# Patient Record
Sex: Male | Born: 1960
Health system: Southern US, Community
[De-identification: ages and names within clinical notes are randomized; demographics above are authoritative.]

## PROBLEM LIST (undated history)

## (undated) DIAGNOSIS — Z86718 Personal history of other venous thrombosis and embolism: Secondary | ICD-10-CM

## (undated) DIAGNOSIS — D6862 Lupus anticoagulant syndrome: Principal | ICD-10-CM

## (undated) DIAGNOSIS — I890 Lymphedema, not elsewhere classified: Secondary | ICD-10-CM

## (undated) DIAGNOSIS — Z87442 Personal history of urinary calculi: Secondary | ICD-10-CM

## (undated) DIAGNOSIS — I10 Essential (primary) hypertension: Secondary | ICD-10-CM

## (undated) DIAGNOSIS — I4891 Unspecified atrial fibrillation: Secondary | ICD-10-CM

## (undated) HISTORY — DX: Lupus anticoagulant syndrome: D68.62

## (undated) HISTORY — PX: NASAL SEPTUM SURGERY: SHX37

## (undated) HISTORY — DX: Personal history of urinary calculi: Z87.442

---

## 1998-05-15 ENCOUNTER — Inpatient Hospital Stay (HOSPITAL_COMMUNITY): Admission: RE | Admit: 1998-05-15 | Discharge: 1998-05-18 | Payer: Self-pay | Admitting: Vascular Surgery

## 1999-08-06 ENCOUNTER — Emergency Department (HOSPITAL_COMMUNITY): Admission: EM | Admit: 1999-08-06 | Discharge: 1999-08-06 | Payer: Self-pay | Admitting: Internal Medicine

## 2001-01-15 ENCOUNTER — Encounter: Payer: Self-pay | Admitting: Family Medicine

## 2001-01-15 ENCOUNTER — Ambulatory Visit (HOSPITAL_COMMUNITY): Admission: RE | Admit: 2001-01-15 | Discharge: 2001-01-15 | Payer: Self-pay | Admitting: Family Medicine

## 2001-02-01 ENCOUNTER — Emergency Department (HOSPITAL_COMMUNITY): Admission: EM | Admit: 2001-02-01 | Discharge: 2001-02-01 | Payer: Self-pay

## 2001-12-22 ENCOUNTER — Ambulatory Visit (HOSPITAL_COMMUNITY): Admission: RE | Admit: 2001-12-22 | Discharge: 2001-12-22 | Payer: Self-pay | Admitting: Family Medicine

## 2002-09-14 ENCOUNTER — Ambulatory Visit (HOSPITAL_COMMUNITY): Admission: RE | Admit: 2002-09-14 | Discharge: 2002-09-14 | Payer: Self-pay | Admitting: *Deleted

## 2002-11-29 ENCOUNTER — Encounter: Payer: Self-pay | Admitting: *Deleted

## 2002-11-29 ENCOUNTER — Ambulatory Visit (HOSPITAL_COMMUNITY): Admission: RE | Admit: 2002-11-29 | Discharge: 2002-11-29 | Payer: Self-pay | Admitting: *Deleted

## 2003-02-26 ENCOUNTER — Ambulatory Visit: Admission: RE | Admit: 2003-02-26 | Discharge: 2003-02-26 | Payer: Self-pay | Admitting: Family Medicine

## 2003-06-13 ENCOUNTER — Encounter: Admission: RE | Admit: 2003-06-13 | Discharge: 2003-06-13 | Payer: Self-pay | Admitting: Family Medicine

## 2003-09-30 ENCOUNTER — Ambulatory Visit (HOSPITAL_COMMUNITY): Admission: RE | Admit: 2003-09-30 | Discharge: 2003-09-30 | Payer: Self-pay | Admitting: *Deleted

## 2004-05-04 ENCOUNTER — Ambulatory Visit: Admission: RE | Admit: 2004-05-04 | Discharge: 2004-05-04 | Payer: Self-pay | Admitting: Family Medicine

## 2004-06-09 ENCOUNTER — Ambulatory Visit: Payer: Self-pay | Admitting: Hematology & Oncology

## 2004-09-14 ENCOUNTER — Ambulatory Visit: Payer: Self-pay | Admitting: Physical Medicine and Rehabilitation

## 2004-09-14 ENCOUNTER — Encounter
Admission: RE | Admit: 2004-09-14 | Discharge: 2004-12-13 | Payer: Self-pay | Admitting: Physical Medicine and Rehabilitation

## 2004-09-21 ENCOUNTER — Encounter
Admission: RE | Admit: 2004-09-21 | Discharge: 2004-10-22 | Payer: Self-pay | Admitting: Physical Medicine and Rehabilitation

## 2004-10-19 ENCOUNTER — Ambulatory Visit: Payer: Self-pay | Admitting: Physical Medicine and Rehabilitation

## 2004-10-26 ENCOUNTER — Encounter
Admission: RE | Admit: 2004-10-26 | Discharge: 2004-11-11 | Payer: Self-pay | Admitting: Physical Medicine and Rehabilitation

## 2004-12-15 ENCOUNTER — Encounter
Admission: RE | Admit: 2004-12-15 | Discharge: 2005-03-15 | Payer: Self-pay | Admitting: Physical Medicine and Rehabilitation

## 2004-12-23 ENCOUNTER — Ambulatory Visit: Payer: Self-pay | Admitting: Physical Medicine and Rehabilitation

## 2005-01-11 ENCOUNTER — Ambulatory Visit: Payer: Self-pay | Admitting: Oncology

## 2005-01-11 ENCOUNTER — Observation Stay (HOSPITAL_COMMUNITY): Admission: EM | Admit: 2005-01-11 | Discharge: 2005-01-12 | Payer: Self-pay | Admitting: *Deleted

## 2005-01-12 ENCOUNTER — Ambulatory Visit: Payer: Self-pay | Admitting: Hematology & Oncology

## 2005-02-02 ENCOUNTER — Ambulatory Visit: Admission: RE | Admit: 2005-02-02 | Discharge: 2005-02-02 | Payer: Self-pay | Admitting: Hematology & Oncology

## 2005-02-17 ENCOUNTER — Ambulatory Visit: Payer: Self-pay | Admitting: Physical Medicine and Rehabilitation

## 2005-03-03 ENCOUNTER — Ambulatory Visit: Payer: Self-pay | Admitting: Hematology & Oncology

## 2005-03-17 ENCOUNTER — Encounter
Admission: RE | Admit: 2005-03-17 | Discharge: 2005-06-15 | Payer: Self-pay | Admitting: Physical Medicine and Rehabilitation

## 2005-04-21 ENCOUNTER — Ambulatory Visit: Payer: Self-pay | Admitting: Physical Medicine and Rehabilitation

## 2005-04-28 ENCOUNTER — Ambulatory Visit: Payer: Self-pay | Admitting: Hematology & Oncology

## 2005-06-10 ENCOUNTER — Ambulatory Visit: Payer: Self-pay | Admitting: Hematology & Oncology

## 2005-06-15 ENCOUNTER — Encounter
Admission: RE | Admit: 2005-06-15 | Discharge: 2005-09-13 | Payer: Self-pay | Admitting: Physical Medicine and Rehabilitation

## 2005-06-15 ENCOUNTER — Ambulatory Visit: Payer: Self-pay | Admitting: Physical Medicine and Rehabilitation

## 2005-06-17 ENCOUNTER — Encounter (INDEPENDENT_AMBULATORY_CARE_PROVIDER_SITE_OTHER): Payer: Self-pay | Admitting: *Deleted

## 2005-06-17 ENCOUNTER — Ambulatory Visit (HOSPITAL_COMMUNITY): Admission: RE | Admit: 2005-06-17 | Discharge: 2005-06-17 | Payer: Self-pay | Admitting: Oncology

## 2005-07-22 LAB — PROTIME-INR: INR: 2.8 (ref 2.00–3.50)

## 2005-07-28 ENCOUNTER — Ambulatory Visit: Payer: Self-pay | Admitting: Hematology & Oncology

## 2005-07-29 LAB — PROTIME-INR

## 2005-08-05 LAB — PROTIME-INR
INR: 3.1 (ref 2.00–3.50)
Protime: 21.3 Seconds — ABNORMAL HIGH (ref 10.6–13.4)

## 2005-08-12 ENCOUNTER — Ambulatory Visit: Payer: Self-pay | Admitting: Physical Medicine and Rehabilitation

## 2005-08-19 ENCOUNTER — Ambulatory Visit (HOSPITAL_COMMUNITY)
Admission: RE | Admit: 2005-08-19 | Discharge: 2005-08-19 | Payer: Self-pay | Admitting: Physical Medicine and Rehabilitation

## 2005-09-12 ENCOUNTER — Encounter
Admission: RE | Admit: 2005-09-12 | Discharge: 2005-09-12 | Payer: Self-pay | Admitting: Physical Medicine and Rehabilitation

## 2005-09-12 ENCOUNTER — Ambulatory Visit: Payer: Self-pay | Admitting: Physical Medicine and Rehabilitation

## 2005-10-04 ENCOUNTER — Encounter
Admission: RE | Admit: 2005-10-04 | Discharge: 2006-01-02 | Payer: Self-pay | Admitting: Physical Medicine and Rehabilitation

## 2005-10-04 ENCOUNTER — Ambulatory Visit: Payer: Self-pay | Admitting: Physical Medicine and Rehabilitation

## 2005-11-04 ENCOUNTER — Ambulatory Visit: Payer: Self-pay | Admitting: Physical Medicine and Rehabilitation

## 2005-11-29 ENCOUNTER — Ambulatory Visit: Payer: Self-pay | Admitting: Physical Medicine and Rehabilitation

## 2005-12-22 ENCOUNTER — Ambulatory Visit: Payer: Self-pay | Admitting: Hematology & Oncology

## 2005-12-22 LAB — PROTIME-INR: Protime: 28.8 Seconds — ABNORMAL HIGH (ref 10.6–13.4)

## 2005-12-23 ENCOUNTER — Encounter: Payer: Self-pay | Admitting: Vascular Surgery

## 2005-12-23 ENCOUNTER — Ambulatory Visit: Admission: RE | Admit: 2005-12-23 | Discharge: 2005-12-23 | Payer: Self-pay | Admitting: Hematology & Oncology

## 2005-12-26 ENCOUNTER — Ambulatory Visit (HOSPITAL_COMMUNITY): Admission: RE | Admit: 2005-12-26 | Discharge: 2005-12-26 | Payer: Self-pay | Admitting: Hematology & Oncology

## 2006-01-24 ENCOUNTER — Ambulatory Visit: Payer: Self-pay | Admitting: Physical Medicine and Rehabilitation

## 2006-01-24 ENCOUNTER — Encounter
Admission: RE | Admit: 2006-01-24 | Discharge: 2006-04-24 | Payer: Self-pay | Admitting: Physical Medicine and Rehabilitation

## 2006-02-14 ENCOUNTER — Ambulatory Visit: Payer: Self-pay | Admitting: Hematology & Oncology

## 2006-02-16 LAB — CBC WITH DIFFERENTIAL/PLATELET
BASO%: 1 % (ref 0.0–2.0)
EOS%: 3.2 % (ref 0.0–7.0)
HGB: 14.9 g/dL (ref 13.0–17.1)
MONO#: 0.4 10*3/uL (ref 0.1–0.9)
MONO%: 6.3 % (ref 0.0–13.0)
NEUT#: 2.8 10*3/uL (ref 1.5–6.5)
Platelets: 248 10*3/uL (ref 145–400)
RBC: 5.38 10*6/uL (ref 4.20–5.71)
RDW: 11.9 % (ref 11.2–14.6)
lymph#: 2.4 10*3/uL (ref 0.9–3.3)

## 2006-02-16 LAB — PROTIME-INR

## 2006-03-02 ENCOUNTER — Ambulatory Visit: Payer: Self-pay | Admitting: Physical Medicine and Rehabilitation

## 2006-03-02 LAB — PROTIME-INR: INR: 3.5 (ref 2.00–3.50)

## 2006-03-16 LAB — PROTIME-INR

## 2006-03-27 ENCOUNTER — Ambulatory Visit: Payer: Self-pay | Admitting: Hematology & Oncology

## 2006-03-30 LAB — PROTIME-INR: Protime: 30 Seconds — ABNORMAL HIGH (ref 10.6–13.4)

## 2006-04-13 ENCOUNTER — Ambulatory Visit: Payer: Self-pay | Admitting: Physical Medicine and Rehabilitation

## 2006-04-27 LAB — PROTIME-INR: Protime: 34.8 Seconds — ABNORMAL HIGH (ref 10.6–13.4)

## 2006-05-09 ENCOUNTER — Encounter
Admission: RE | Admit: 2006-05-09 | Discharge: 2006-08-07 | Payer: Self-pay | Admitting: Physical Medicine and Rehabilitation

## 2006-05-09 ENCOUNTER — Ambulatory Visit: Payer: Self-pay | Admitting: Hematology & Oncology

## 2006-05-11 LAB — PROTIME-INR: Protime: 25.2 Seconds — ABNORMAL HIGH (ref 10.6–13.4)

## 2006-06-08 LAB — PROTIME-INR
INR: 2 (ref 2.00–3.50)
Protime: 24 Seconds — ABNORMAL HIGH (ref 10.6–13.4)

## 2006-06-20 ENCOUNTER — Ambulatory Visit: Payer: Self-pay | Admitting: Hematology & Oncology

## 2006-06-22 LAB — PROTIME-INR
INR: 3 (ref 2.00–3.50)
Protime: 36 Seconds — ABNORMAL HIGH (ref 10.6–13.4)

## 2006-07-06 LAB — CBC WITH DIFFERENTIAL/PLATELET
BASO%: 1 % (ref 0.0–2.0)
EOS%: 5 % (ref 0.0–7.0)
HCT: 46.1 % (ref 38.7–49.9)
MCH: 27.7 pg — ABNORMAL LOW (ref 28.0–33.4)
MCHC: 34 g/dL (ref 32.0–35.9)
MONO#: 0.4 10*3/uL (ref 0.1–0.9)
RBC: 5.67 10*6/uL (ref 4.20–5.71)
RDW: 11.4 % (ref 11.2–14.6)
WBC: 6.6 10*3/uL (ref 4.0–10.0)
lymph#: 2.1 10*3/uL (ref 0.9–3.3)

## 2006-07-27 LAB — PROTIME-INR
INR: 1.9 — ABNORMAL LOW (ref 2.00–3.50)
Protime: 22.8 Seconds — ABNORMAL HIGH (ref 10.6–13.4)

## 2006-08-15 ENCOUNTER — Ambulatory Visit: Payer: Self-pay | Admitting: Hematology & Oncology

## 2006-08-17 LAB — PROTIME-INR: Protime: 19.2 Seconds — ABNORMAL HIGH (ref 10.6–13.4)

## 2006-09-07 LAB — PROTIME-INR: Protime: 31.2 Seconds — ABNORMAL HIGH (ref 10.6–13.4)

## 2006-09-26 ENCOUNTER — Ambulatory Visit: Payer: Self-pay | Admitting: Hematology & Oncology

## 2006-09-28 LAB — PROTIME-INR
INR: 3.5 (ref 2.00–3.50)
Protime: 42 Seconds — ABNORMAL HIGH (ref 10.6–13.4)

## 2006-10-19 LAB — PROTIME-INR: INR: 2.3 (ref 2.00–3.50)

## 2006-10-19 LAB — CBC WITH DIFFERENTIAL/PLATELET
BASO%: 0.6 % (ref 0.0–2.0)
EOS%: 1.9 % (ref 0.0–7.0)
MCH: 27 pg — ABNORMAL LOW (ref 28.0–33.4)
MCHC: 33.9 g/dL (ref 32.0–35.9)
MONO%: 8 % (ref 0.0–13.0)
NEUT%: 61.1 % (ref 40.0–75.0)
RDW: 11.1 % — ABNORMAL LOW (ref 11.2–14.6)
lymph#: 1.9 10*3/uL (ref 0.9–3.3)

## 2006-11-06 ENCOUNTER — Ambulatory Visit: Payer: Self-pay | Admitting: Hematology & Oncology

## 2006-11-09 ENCOUNTER — Encounter: Admission: RE | Admit: 2006-11-09 | Discharge: 2007-02-07 | Payer: Self-pay | Admitting: Family Medicine

## 2006-11-09 LAB — PROTIME-INR

## 2006-11-14 ENCOUNTER — Ambulatory Visit: Admission: RE | Admit: 2006-11-14 | Discharge: 2006-11-14 | Payer: Self-pay | Admitting: Hematology & Oncology

## 2006-11-14 ENCOUNTER — Encounter: Payer: Self-pay | Admitting: Hematology & Oncology

## 2006-12-19 ENCOUNTER — Ambulatory Visit: Payer: Self-pay | Admitting: Hematology & Oncology

## 2006-12-21 LAB — PROTIME-INR: Protime: 33.6 Seconds — ABNORMAL HIGH (ref 10.6–13.4)

## 2007-01-11 LAB — CBC WITH DIFFERENTIAL/PLATELET
BASO%: 0.9 % (ref 0.0–2.0)
Eosinophils Absolute: 0.3 10*3/uL (ref 0.0–0.5)
LYMPH%: 31.2 % (ref 14.0–48.0)
MONO#: 0.4 10*3/uL (ref 0.1–0.9)
NEUT#: 3.4 10*3/uL (ref 1.5–6.5)
Platelets: 235 10*3/uL (ref 145–400)
RBC: 5.17 10*6/uL (ref 4.20–5.71)
RDW: 11.1 % — ABNORMAL LOW (ref 11.2–14.6)
WBC: 6.1 10*3/uL (ref 4.0–10.0)
lymph#: 1.9 10*3/uL (ref 0.9–3.3)

## 2007-01-11 LAB — PROTIME-INR: INR: 2.4 (ref 2.00–3.50)

## 2007-01-30 ENCOUNTER — Ambulatory Visit: Payer: Self-pay | Admitting: Hematology & Oncology

## 2007-02-01 LAB — PROTIME-INR: INR: 2.1 (ref 2.00–3.50)

## 2007-02-14 LAB — PROTIME-INR
INR: 2.9 (ref 2.00–3.50)
Protime: 34.8 Seconds — ABNORMAL HIGH (ref 10.6–13.4)

## 2007-03-12 ENCOUNTER — Ambulatory Visit: Payer: Self-pay | Admitting: Hematology & Oncology

## 2007-04-24 ENCOUNTER — Ambulatory Visit: Payer: Self-pay | Admitting: Hematology & Oncology

## 2007-04-26 LAB — CBC WITH DIFFERENTIAL/PLATELET
BASO%: 0.7 % (ref 0.0–2.0)
HCT: 43.3 % (ref 38.7–49.9)
LYMPH%: 29.4 % (ref 14.0–48.0)
MCHC: 33.7 g/dL (ref 32.0–35.9)
MCV: 79.2 fL — ABNORMAL LOW (ref 81.6–98.0)
MONO#: 0.4 10*3/uL (ref 0.1–0.9)
NEUT%: 61.2 % (ref 40.0–75.0)
Platelets: 240 10*3/uL (ref 145–400)
WBC: 6.4 10*3/uL (ref 4.0–10.0)

## 2007-06-26 ENCOUNTER — Ambulatory Visit: Payer: Self-pay | Admitting: Hematology & Oncology

## 2007-07-26 LAB — PROTIME-INR: Protime: 37.2 Seconds — ABNORMAL HIGH (ref 10.6–13.4)

## 2007-08-21 ENCOUNTER — Ambulatory Visit: Payer: Self-pay | Admitting: Hematology & Oncology

## 2008-04-14 ENCOUNTER — Ambulatory Visit: Payer: Self-pay | Admitting: Hematology & Oncology

## 2008-04-14 LAB — PROTIME-INR (CHCC SATELLITE)
INR: 1.3 — ABNORMAL LOW (ref 2.0–3.5)
Protime: 15.6 Seconds — ABNORMAL HIGH (ref 10.6–13.4)

## 2009-07-15 ENCOUNTER — Ambulatory Visit: Payer: Self-pay | Admitting: Hematology & Oncology

## 2009-07-15 LAB — CBC WITH DIFFERENTIAL (CANCER CENTER ONLY)
BASO#: 0 10*3/uL (ref 0.0–0.2)
BASO%: 0.6 % (ref 0.0–2.0)
HGB: 13.6 g/dL (ref 13.0–17.1)
LYMPH#: 1.4 10*3/uL (ref 0.9–3.3)
MCV: 81 fL — ABNORMAL LOW (ref 82–98)
MONO#: 0.2 10*3/uL (ref 0.1–0.9)
NEUT#: 2.8 10*3/uL (ref 1.5–6.5)
NEUT%: 60.5 % (ref 40.0–80.0)
RDW: 11.9 % (ref 10.5–14.6)

## 2009-07-15 LAB — PROTIME-INR (CHCC SATELLITE)

## 2009-07-15 LAB — VITAMIN D 25 HYDROXY (VIT D DEFICIENCY, FRACTURES): Vit D, 25-Hydroxy: 21 ng/mL — ABNORMAL LOW (ref 30–89)

## 2009-07-17 LAB — LUPUS ANTICOAGULANT PANEL
DRVVT: 118.2 secs — ABNORMAL HIGH (ref 36.2–44.3)
Drvvt confirmation: 2.79 Ratio — ABNORMAL HIGH (ref <1.21)
PTT Lupus Anticoagulant: 86.5 secs — ABNORMAL HIGH (ref 32.0–43.4)
PTTLA Confirmation: 22.8 secs — ABNORMAL HIGH (ref <8.0)

## 2009-07-22 LAB — HEXAGONAL PHOSPHOLIPID NEUTRALIZATION: Hex Phosph Neut Test: POSITIVE — AB

## 2009-07-31 LAB — PROTIME-INR (CHCC SATELLITE)
INR: 2.8 (ref 2.0–3.5)
Protime: 33.6 Seconds — ABNORMAL HIGH (ref 10.6–13.4)

## 2009-08-25 ENCOUNTER — Ambulatory Visit: Payer: Self-pay | Admitting: Hematology & Oncology

## 2009-08-26 LAB — PROTIME-INR
INR: 1.7 — ABNORMAL LOW (ref 2.00–3.50)
Protime: 20.4 Seconds — ABNORMAL HIGH (ref 10.6–13.4)

## 2009-10-02 ENCOUNTER — Ambulatory Visit: Payer: Self-pay | Admitting: Hematology & Oncology

## 2009-11-16 ENCOUNTER — Ambulatory Visit: Payer: Self-pay | Admitting: Hematology & Oncology

## 2009-11-18 LAB — CBC WITH DIFFERENTIAL (CANCER CENTER ONLY)
BASO#: 0 10*3/uL (ref 0.0–0.2)
BASO%: 0.5 % (ref 0.0–2.0)
Eosinophils Absolute: 0.1 10*3/uL (ref 0.0–0.5)
HCT: 39.6 % (ref 38.7–49.9)
HGB: 13.6 g/dL (ref 13.0–17.1)
LYMPH%: 45.9 % (ref 14.0–48.0)
MCH: 27.7 pg — ABNORMAL LOW (ref 28.0–33.4)
MCHC: 34.4 g/dL (ref 32.0–35.9)
MCV: 80 fL — ABNORMAL LOW (ref 82–98)
NEUT%: 36.3 % — ABNORMAL LOW (ref 40.0–80.0)
RBC: 4.91 10*6/uL (ref 4.20–5.70)
WBC: 4.5 10*3/uL (ref 4.0–10.0)

## 2009-11-18 LAB — PROTIME-INR (CHCC SATELLITE): Protime: 22.8 Seconds — ABNORMAL HIGH (ref 10.6–13.4)

## 2009-12-28 ENCOUNTER — Ambulatory Visit: Payer: Self-pay | Admitting: Oncology

## 2010-02-09 ENCOUNTER — Ambulatory Visit: Payer: Self-pay | Admitting: Hematology & Oncology

## 2010-02-11 LAB — CBC WITH DIFFERENTIAL (CANCER CENTER ONLY)
BASO#: 0 10*3/uL (ref 0.0–0.2)
EOS%: 4.6 % (ref 0.0–7.0)
HCT: 38.9 % (ref 38.7–49.9)
LYMPH#: 2.1 10*3/uL (ref 0.9–3.3)
LYMPH%: 41.6 % (ref 14.0–48.0)
MONO#: 0.3 10*3/uL (ref 0.1–0.9)
MONO%: 6.8 % (ref 0.0–13.0)
WBC: 5 10*3/uL (ref 4.0–10.0)

## 2010-02-11 LAB — PROTIME-INR (CHCC SATELLITE): INR: 1.8 — ABNORMAL LOW (ref 2.0–3.5)

## 2010-02-15 ENCOUNTER — Telehealth (INDEPENDENT_AMBULATORY_CARE_PROVIDER_SITE_OTHER): Payer: Self-pay

## 2010-03-02 ENCOUNTER — Ambulatory Visit: Payer: Self-pay | Admitting: Gastroenterology

## 2010-03-02 ENCOUNTER — Encounter: Payer: Self-pay | Admitting: Urgent Care

## 2010-03-02 DIAGNOSIS — K921 Melena: Secondary | ICD-10-CM

## 2010-03-02 DIAGNOSIS — Z8672 Personal history of thrombophlebitis: Secondary | ICD-10-CM

## 2010-03-02 DIAGNOSIS — I4891 Unspecified atrial fibrillation: Secondary | ICD-10-CM

## 2010-03-02 DIAGNOSIS — I10 Essential (primary) hypertension: Secondary | ICD-10-CM

## 2010-03-02 DIAGNOSIS — I890 Lymphedema, not elsewhere classified: Secondary | ICD-10-CM

## 2010-03-02 DIAGNOSIS — D689 Coagulation defect, unspecified: Secondary | ICD-10-CM

## 2010-03-08 ENCOUNTER — Encounter: Payer: Self-pay | Admitting: Urgent Care

## 2010-03-09 ENCOUNTER — Encounter: Payer: Self-pay | Admitting: Internal Medicine

## 2010-03-18 ENCOUNTER — Ambulatory Visit (HOSPITAL_COMMUNITY): Admission: RE | Admit: 2010-03-18 | Payer: Self-pay | Source: Home / Self Care | Admitting: Internal Medicine

## 2010-03-22 ENCOUNTER — Ambulatory Visit: Payer: Self-pay | Admitting: Hematology & Oncology

## 2010-04-12 LAB — PROTIME-INR (CHCC SATELLITE)
INR: 3.6 — ABNORMAL HIGH (ref 2.0–3.5)
Protime: 43.2 Seconds — ABNORMAL HIGH (ref 10.6–13.4)

## 2010-04-25 ENCOUNTER — Encounter: Payer: Self-pay | Admitting: Hematology & Oncology

## 2010-04-25 ENCOUNTER — Encounter: Payer: Self-pay | Admitting: Physical Medicine and Rehabilitation

## 2010-05-04 NOTE — Letter (Signed)
Summary: AUTH FOR THE RELEASE OF MED INFO  AUTH FOR THE RELEASE OF MED INFO   Imported By: Rexene Alberts 03/02/2010 14:29:00  _____________________________________________________________________  External Attachment:    Type:   Image     Comment:   External Document

## 2010-05-04 NOTE — Progress Notes (Signed)
----   Converted from flag ---- ---- 02/15/2010 12:00 PM, Diana Eves wrote: you started a triage on this patient and I have scheduled him an OV on 11/29 to see KJ, but pt wants RMR to do TCS not SF. ------------------------------  Appended Document: OPV PRIOR TO TCS/FH + CRC,BLOOD CLOT IN R LEG 2004/SS Last INR 1.9, goal: 2.  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  OK to schedule TCS on COUMADIN?  Appended Document: OPV PRIOR TO TCS/FH + CRC,BLOOD CLOT IN R LEG 2004/SS I called pt to schedule tcs,lmom.  Appended Document: OPV PRIOR TO TCS/FH + CRC,BLOOD CLOT IN R LEG 2004/SS Pt scheduled for 03/18/10@7 :30..Pt aware of appt.

## 2010-05-04 NOTE — Letter (Signed)
Summary: RECORDS FROM DR [facility] FROM DR ENNEVER   Imported By: Rexene Alberts 03/08/2010 11:18:08  _____________________________________________________________________  External Attachment:    Type:   Image     Comment:   External Document

## 2010-05-06 NOTE — Assessment & Plan Note (Signed)
Summary: OPV PRIOR TO TCS/FH + CRC,BLOOD CLOT IN R LEG 2004/SS   Visit Type:  Initial Visit Referring Provider:  Dr Severiano Gilbert Primary Care Provider:  Severiano Gilbert, MD [facility]  Chief Complaint:  ov prior to TCS.  History of Present Illness: 50 y/o caucasian male here to set up colonscopy.  FH colon CA father dx age 47.  Had small amt bright red blood on toilet paper a couple weeks ago w/ wipind.  Denies abd pain, N/V/D or constipation.  Wt stable & appetite ok.  Denies hx GERD.  Denies dysphagia or odynophagia.  On coumadin 10mg  daily since DVT 2004.  This was his 3rd DVT.  Sees Dr Burna Cash.  Denies any easy brusing or bleeding.         Current Problems (verified): 1)  Lymphedema, Right Leg  (ICD-457.1) 2)  Hypertension  (ICD-401.9) 3)  Hematochezia  (ICD-578.1) 4)  Deep Venous Thrombophlebitis, Hx of  (ICD-V12.52) 5)  Atrial Fibrillation  (ICD-427.31)  Current Medications (verified): 1)  Coumadin 10 Mg Tabs (Warfarin Sodium) .... As Directed 2)  Prinzide 20-12.5 Mg Tabs (Lisinopril-Hydrochlorothiazide) .... Take 1 Tablet By Mouth Once A Day 3)  Lopressor 50 Mg Tabs (Metoprolol Tartrate) .... Take 1 Tablet By Mouth Two Times A Day 4)  Norco 10-325 Mg Tabs (Hydrocodone-Acetaminophen) .... 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  Alcohol Use - no Illicit Drug Use - no Patient does not get regular exercise.  Smoking Status:  never Drug Use:   no Does Patient Exercise:  no  Review of Systems General:  Denies fever, chills, sweats, anorexia, fatigue, weakness, malaise, weight loss, and sleep disorder. CV:  Denies chest pains, angina, palpitations, syncope, dyspnea on exertion, orthopnea, PND, peripheral edema, and claudication. Resp:  Denies dyspnea at rest, dyspnea with exercise, cough, sputum, wheezing, coughing up blood, and pleurisy. GI:  Denies difficulty swallowing, pain on swallowing, jaundice, and fecal incontinence. GU:  Denies urinary burning, blood in urine, urinary frequency, urinary hesitancy, nocturnal urination, and urinary incontinence. MS:  Complains of leg pain with exertion; denies joint pain / LOM, joint swelling, joint stiffness, joint deformity, low back pain, muscle weakness, muscle cramps, muscle atrophy, leg pain at night, and shoulder pain / LOM hand / wrist pain (CTS); right. Derm:  Denies rash, itching, dry skin, hives, moles, warts, and unhealing ulcers. Psych:  Denies depression, anxiety, memory loss, suicidal ideation, hallucinations, paranoia, phobia, and confusion. Heme:  Denies bruising, bleeding, and enlarged lymph nodes.  Vital Signs:  Patient profile:   50 year old male Height:      76 inches Weight:      259 pounds BMI:     31.64 Temp:     97.6 degrees F oral Pulse rate:   64 / minute BP sitting:   100 / 70  (left arm) Cuff size:   large  Vitals Entered By: Cloria Spring LPN (March 02, 2010 1:53 PM)  Physical Exam  General:  Well developed, well nourished, no acute distress. Head:  Normocephalic and atraumatic. Eyes:  Sclera clear, no  icterus. Ears:  Normal auditory acuity. Nose:  No deformity, discharge,  or lesions. Mouth:  No deformity or lesions, dentition normal. Neck:  Supple; no masses or thyromegaly. Lungs:  Clear throughout to auscultation. Heart:  Regular rate and rhythm; no murmurs, rubs,  or bruits. Abdomen:  Soft, nontender and nondistended. No masses,  hepatosplenomegaly or hernias noted. Normal bowel sounds.without guarding and without rebound.   Rectal:  deferred until time of colonoscopy.   Msk:  Symmetrical with no gross deformities. Normal posture. Pulses:  Normal pulses noted. Extremities:  No clubbing, cyanosis, edema or deformities noted. Neurologic:  Alert and  oriented x4;  grossly normal neurologically. Skin:  Intact without significant lesions or rashes. Cervical Nodes:  No significant cervical adenopathy. Psych:  Alert and cooperative. Normal mood and affect.  Impression & Recommendations:  Problem # 1:  HEMATOCHEZIA (ICD-578.1) 50 y/o caucasian male on chronic coumadin therapy secondary to "clotting disorder" with hx DVT x3 here to setup colonoscopy.  Family hx significant for father w/ colon ca and we do need to r/o colorectal ca or polyps, although this may be of benign anorectal source.   Most likely Mr. Sonnenfeld's colonscoopy would be performed on coumadin given his significant risk of life-threatening embolic event.  We also discussed the increased risk of bleeding while on coumadin.  I have requested Dr. Junie Bame last note & INR for our review & will be discussing management of coumadin w/ Dr Jena Gauss prior to his colonsocpy.    Diagnostic colonoscopy to be performed by Dr. Suszanne Conners Rourk in the near future.  I have discussed risks and benefits which include, but are not limited to, bleeding, infection, perforation, or medication reaction.  The patient agrees with this plan and consent will be obtained.  Orders: Consultation Level III (82956)  Problem # 2:  OTHER AND UNSPECIFIED COAGULATION DEFECTS (ICD-286.9)  See #1  Problem # 3:  ATRIAL FIBRILLATION (ICD-427.31) Assessment: Comment Only  Problem # 4:  DEEP VENOUS THROMBOPHLEBITIS, HX OF (ICD-V12.52) Assessment: Comment Only  Problem # 5:  NEOPLASM, MALIGNANT, COLON, CARCINOMA, FAMILY HX (ICD-V16.0)  Orders: Consultation Level III (21308)  Appended Document: OPV  PRIOR TO TCS/FH + CRC,BLOOD CLOT IN R LEG 2004/SS Last INR 1.9, goal: 2.  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  OK to schedule TCS on COUMADIN?  Appended Document: OPV PRIOR TO TCS/FH + CRC,BLOOD CLOT IN R LEG 2004/SS I called pt to schedule tcs,lmom.  Appended Document: OPV PRIOR TO TCS/FH + CRC,BLOOD CLOT IN R LEG 2004/SS Pt scheduled for 03/18/10@7 :30..Pt aware of appt.

## 2010-05-06 NOTE — Letter (Signed)
Summary: TCS ORDER  TCS ORDER   Imported By: Ave Filter 03/09/2010 12:17:57  _____________________________________________________________________  External Attachment:    Type:   Image     Comment:   External Document  Appended Document: TCS ORDER Pt called and cx procedure with Panya at [facility]. He doesn't have the money for this procedure because his ins. will only cover the tcs at 85%.

## 2010-07-14 ENCOUNTER — Encounter (HOSPITAL_BASED_OUTPATIENT_CLINIC_OR_DEPARTMENT_OTHER): Payer: 59 | Admitting: Hematology & Oncology

## 2010-07-14 ENCOUNTER — Other Ambulatory Visit: Payer: Self-pay | Admitting: Hematology & Oncology

## 2010-07-14 DIAGNOSIS — D6859 Other primary thrombophilia: Secondary | ICD-10-CM

## 2010-07-14 DIAGNOSIS — Z86718 Personal history of other venous thrombosis and embolism: Secondary | ICD-10-CM

## 2010-07-14 DIAGNOSIS — Z7901 Long term (current) use of anticoagulants: Secondary | ICD-10-CM

## 2010-07-14 DIAGNOSIS — I87009 Postthrombotic syndrome without complications of unspecified extremity: Secondary | ICD-10-CM

## 2010-07-14 LAB — PROTIME-INR (CHCC SATELLITE)

## 2010-07-14 LAB — CBC WITH DIFFERENTIAL (CANCER CENTER ONLY)
BASO#: 0 10*3/uL (ref 0.0–0.2)
BASO%: 0.2 % (ref 0.0–2.0)
EOS%: 4.5 % (ref 0.0–7.0)
HGB: 13 g/dL (ref 13.0–17.1)
LYMPH#: 1.9 10*3/uL (ref 0.9–3.3)
LYMPH%: 33.8 % (ref 14.0–48.0)
MCH: 27.4 pg — ABNORMAL LOW (ref 28.0–33.4)
MONO#: 0.5 10*3/uL (ref 0.1–0.9)
NEUT%: 53.3 % (ref 40.0–80.0)
WBC: 5.6 10*3/uL (ref 4.0–10.0)

## 2010-07-26 ENCOUNTER — Encounter (HOSPITAL_BASED_OUTPATIENT_CLINIC_OR_DEPARTMENT_OTHER): Payer: 59 | Admitting: Oncology

## 2010-07-26 ENCOUNTER — Other Ambulatory Visit: Payer: Self-pay | Admitting: Hematology & Oncology

## 2010-07-26 DIAGNOSIS — D6859 Other primary thrombophilia: Secondary | ICD-10-CM

## 2010-07-26 DIAGNOSIS — Z86718 Personal history of other venous thrombosis and embolism: Secondary | ICD-10-CM

## 2010-07-26 DIAGNOSIS — I87009 Postthrombotic syndrome without complications of unspecified extremity: Secondary | ICD-10-CM

## 2010-07-26 LAB — PROTIME-INR: INR: 2 (ref 2.00–3.50)

## 2010-08-20 NOTE — H&P (Signed)
NAMESALIOU, BARNIER NO.:  [id number]   MEDICAL RECORD NO.:  [id number]            PATIENT TYPE:   LOCATION:                                 FACILITY:   PHYSICIAN:  Deirdre Peer. Polite, M.D. DATE OF BIRTH:  [birth year]   DATE OF ADMISSION:  DATE OF DISCHARGE:                                HISTORY & PHYSICAL   CHIEF COMPLAINT:  Right leg pain.   HISTORY OF PRESENT ILLNESS:  [name] is a 50 year old male with history  of hypertension and DVT x2 in the right lower extremity who presents to the  ED for evaluation of the above complaint.  Patient states that he was en  route via airplane to [facility] when he noted swelling and pain in his right  lower extremity.  Because of prior similar events that resulted in DVT  patient returned to [facility] for evaluation.  Patient saw his primary  M.D., had Coumadin level checked which was within normal limits.  Ultimately  underwent ultrasound that showed clot in the right lower extremity.  Because  the patient's Coumadin was therapeutic and he has recurrent clot, it is  recommended the patient proceed to the hospital for further evaluation and  treatment and possibly considered Coumadin failure.  Of note, patient denies  any trauma to his leg, but does admit to having recurrent DVT x2 in 2000 and  in 2004.  According to the patient, they always seem to result after being  on a plane ride.  Patient states that he has been diagnosed with a lupus  anticoagulant disorder in the past.  Admission is deemed necessary for  further evaluation and treatment.   PAST MEDICAL HISTORY:  1.  As stated above.  2.  Significant for recurrent DVT in 2000 and 2004.  3.  Hypertension.  4.  Chronic back pain.  5.  Questionable irregular heartbeat.   MEDICATIONS ON ADMISSION:  1.  Coumadin 9 mg daily.  2.  Lopressor 50 mg b.i.d.  3.  Prinzide 20/12.5 daily.  4.  Pain medication which he is unsure of the name.   SOCIAL HISTORY:  Negative  for tobacco, alcohol, or drugs.   PAST SURGICAL HISTORY:  Significant for nasal surgery in the past.   ALLERGIES:  No known drug allergies.   FAMILY HISTORY:  Mother is deceased secondary to melanoma.  Father deceased,  history of colon cancer.  Brothers and sisters healthy.   REVIEW OF SYSTEMS:  Negative for chest pain, shortness of breath.  No  nausea.  No vomiting.  No bleeding.   PHYSICAL EXAMINATION:  GENERAL:  Patient is alert and oriented x3.  No  apparent distress.  VITAL SIGNS:  Temperature 97.9, blood pressure 124/89, pulse 69, respiratory  rate 18, saturating 97% on room air.  HEENT:  Within normal limits.  CHEST:  Clear to auscultation bilaterally.  CARDIOVASCULAR:  Regular.  No S3.  ABDOMEN:  Slightly obese, nontender.  No mass.  EXTREMITIES:  Patient has swelling in the right lower leg particularly in  the calf area that has associated pain with palpation.  No cord could be  appreciated.  Pulses 2+, intact.  RECTAL:  Deferred.  NEUROLOGIC:  Nonfocal.   DATA:  Ultrasound showing recurrent clot in the right lower extremity.  Other laboratories are pending at the time of this dictation.   ASSESSMENT:  1.  Recurrent deep venous thrombosis in the setting of therapeutic Coumadin.  2.  Hypertension.  3.  History of lupus anticoagulant disorder.   Recommend patient be admitted to a telemetry floor bed.  Patient will be  covered with Lovenox.  As the patient has known clotting disorder, will ask  hematology to see the patient.  May have to consider the patient a Coumadin  failure as it was stated his INR is within therapeutic range and despite  that the patient has developed recurrent DVT.  Will ask hematology's effort  as far as changing to a different anticoagulant, i.e., Lovenox plus or minus  placement of a [place] filter to prevent potential lethal PE.  Make  further recommendations after review of the above studies.      Deirdre Peer. Polite, M.D.   Electronically Signed     RDP/MEDQ  D:  01/12/2005  T:  01/12/2005  Job:  [id number]   cc:   Dario Guardian, M.D.  Fax: [phone]

## 2010-08-20 NOTE — Group Therapy Note (Signed)
HISTORY OF PRESENT ILLNESS:  [name] is a married white gentleman who is  referred by Dr. Merri Phillips for treatment of his right lower extremity and  low back pain. [name] has approximately a 2 year history of swollen  right leg secondary to deep vein thrombosis x2. He has recently undergone a  workup for coagulopathy. I do not have any notes regarding the conclusion. I  have some blood work regarding the workup. He was seen by Dr. Myna Phillips. The  patient is unsure of the swelling. [name] also had some complaints of  some low back pain, which are equal in intensity to his right leg pain. He  describes his right leg pain as throbbing, worse at the end of the day.  Sometimes it wakes him up at night. Better at the beginning of the day. He  describes the pain about a 7 to 8 on a scale of 10. Apparently worsened by  prolonged sitting or standing on the leg. His right low back pain is  described as aching. Worse with standing as well. Not associated with any  numbness, tingling, or weakness. He does have some mild intermittent medial  thigh tingling, which is not a major problem for him. He has been treated in  the past with Vicodin and reports that he gets fairly good relief with that  medication. He has recently gotten some compression stockings for his right  lower extremity, however, he has not been wearing them. He reports that he  is able to walk unlimited distances. He is able to climb stairs and drives.  He is employed 40 hours a week. Sometimes works between 50 and 60 hours a  week. He works as a Art gallery manager. He does some  international travel about 2 weeks out of the month. His employer is [employer]. He  last worked earlier today. Reports some anxiety and mild tingling in the  medial thigh. Otherwise, no neurologic symptoms are noted. No weakness. No  bladder or bowel control problems. No tremors. No spasms. The patient  reports a history of irregular atrial  fibrillation and hypertension.  Otherwise denies diabetes, ulcers, cancers, kidney problems, thyroid  problems, liver problems.   PAST SURGICAL HISTORY:  Deviated septum.   SOCIAL HISTORY:  The patient lives with his wife and 2 children, an 28 and a  28 year old. Denies alcohol or smoking.   FAMILY HISTORY:  Father died age 71 of colon cancer. Mother died age 72 of  melanoma.   PHYSICAL EXAMINATION:  VITAL SIGNS:  Blood pressure 131/78, respiratory rate  16, pulse 63, 98% saturated on room air.  GENERAL:  A well developed, well nourished gentleman. Does not appear in any  distress during out interview. His oriented x3. Affect overall bright and  alert. Cooperative. Gait is essentially normal. Normal stride length. He is  able to flex forward and extend back. Lateral flexion in the lumbar spine.  Reports some increased pain with flexion and extension. Seated, reflexes are  2+ at the knees and ankles. Toes are downgoing. No clonus is noted. Normal  tone throughout lower extremities is noted. This is a slight decrease in the  EHL with strength on the right versus left. Both are, however, in the 5 over  5 range and very, very subtle difference side-to-side. Straight leg raise is  negative. No sensory deficits to slight touch or pin prick are noted in the  lower extremities. Calf size on the right  is 51 cm, 48 on the left is noted.   IMPRESSION:  1.  The patient has a history of seronegative arthritis, psoriasis, history      of deep vein thrombosis right lower extremity x2 and currently on      Coumadin. Coagulopathy. Unsure of diagnosis at this point. Need to get      some medical records regarding this. I was also questioned of having a      colon cancer workup. This has not been completed.  2.  The patient also complains of insomnia.  3.  Right calf edema and pain.  4.  Chronic low back pain.   PLAN:  I do not have any good studies on the lumbar spine. There is a study  of a CT of  the pelvis, which showed normal SI joints. May consider at some  point, a MRI of the lumbar spine. Regarding the right lower extremity edema,  would like to get him involved in an edema program through the physical  therapist to work on fluid mobilization followed by some appropriate  compression and the re-ordering some stockings once his leg becomes more  normal size. Will get this ordered. Will have him attend therapy 2 to 3  times a week. Will see him back in a month. With his work schedule, I  anticipate that it may take 2 to 3 months to get his right leg symmetric  with the left leg. Once UDS is checked, will add 10/325  hydrocodone/acetaminophen twice a day, if he has a negative UDS.       DMK/MedQ  D:  09/15/2004 17:06:25  T:  09/16/2004 00:57:38  Job #:  [id number]

## 2010-08-20 NOTE — Assessment & Plan Note (Signed)
Friday, April 14, 2006   [name] is a 50 year old gentleman who is being seen in our [facility] for chronic right leg pain.  He has a history of a  chronic DVT in this leg with persistent lymphedema.  His leg  circumference waxes and wans depending upon whether he is using his  compression hose and is actively engaged in wrapping his lower  extremity.  He is back in today, states his pain continues to be an 8 or  9 on a scale of 10.  He states he has been sick with abdominal viral  symptoms with some nausea, vomiting, and malaise.  Because of his  current illness, he states he did not bother putting on his compression  hose today.  He reports, overall, poor sleep.  He reports good relief  with the current pain medicines that he is on.   From this clinic, he continues to use Norco 10/325, 1-2 p.o. b.i.d.  p.r.n. right leg pain and Flexeril 10 mg once a day.  He reports no  problems with constipation or early sedation from these medications.  He  is able to walk about 30 minutes at a time, is able to climb stairs, and  drive.  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  He states  he does not have any problems with depression, anxiety, or suicidal  ideation today.   No change in the past medical, social, or family history since his last  visit.   PHYSICAL EXAMINATION:  Blood pressure 112/73, pulse 70, respirations 17,  97% saturation on room air, temperature 98.5. He is a well developed,  well nourished gentleman who appears his stated age.  His speech is  clear.  His affect is bright and cooperative, pleasant.  He does not  appear in any distress.  He transitions from sit to stand without any  difficulty.  Gait appears to be entirely normal.  Examination of the  right lower extremity reveals skin changes up to the mid tibia secondary  to chronic lymphedema.  The circumference of the right calf is 17 cm,  below the pelvic fold is 38 cm, which is  down 1 cm from last month.   IMPRESSION:  1. Right lower extremity pain.  2. Right lower extremity lymphedema with skin changes.   PLAN:  Will refill his Norco 10/325, 1-2 p.o. q.b.i.d., #120, Flexeril  10 mg 1 p.o. daily, #30.  I have requested he return back to physical  therapy to work on this chronic edema with wrapping the leg and possibly  re-ordering some hose for him.  He states that he is unable to pursue  this therapeutic plan due to financial reasons.  I did encourage him to  use his compression hose, especially on that right leg.  He states he  does use the hose a good part of the time.  We will see him back in  three months.  A nursing visit over the next two months.  [name] has  been stable on these medications.  Consider EDS next visit.           ______________________________  Brantley Stage, M.D.     DMK/MedQ  D:  04/14/2006 13:06:29  T:  04/14/2006 16:44:24  Job #:  [id number]

## 2010-08-23 ENCOUNTER — Other Ambulatory Visit: Payer: Self-pay | Admitting: Hematology & Oncology

## 2010-08-23 ENCOUNTER — Encounter (HOSPITAL_BASED_OUTPATIENT_CLINIC_OR_DEPARTMENT_OTHER): Payer: 59 | Admitting: Oncology

## 2010-08-23 DIAGNOSIS — I87009 Postthrombotic syndrome without complications of unspecified extremity: Secondary | ICD-10-CM

## 2010-08-23 DIAGNOSIS — D6859 Other primary thrombophilia: Secondary | ICD-10-CM

## 2010-08-23 DIAGNOSIS — Z86718 Personal history of other venous thrombosis and embolism: Secondary | ICD-10-CM

## 2010-08-23 LAB — PROTIME-INR: INR: 1.7 — ABNORMAL LOW (ref 2.00–3.50)

## 2011-05-11 ENCOUNTER — Other Ambulatory Visit: Payer: Self-pay | Admitting: Hematology & Oncology

## 2011-05-11 NOTE — Telephone Encounter (Signed)
Pt called wanted appointment to see MD. He is aware of 05-16-11 appointment

## 2011-05-16 ENCOUNTER — Ambulatory Visit (HOSPITAL_BASED_OUTPATIENT_CLINIC_OR_DEPARTMENT_OTHER): Payer: 59 | Admitting: Hematology & Oncology

## 2011-05-16 ENCOUNTER — Other Ambulatory Visit (HOSPITAL_BASED_OUTPATIENT_CLINIC_OR_DEPARTMENT_OTHER): Payer: 59 | Admitting: Lab

## 2011-05-16 ENCOUNTER — Other Ambulatory Visit: Payer: Self-pay | Admitting: *Deleted

## 2011-05-16 ENCOUNTER — Encounter: Payer: Self-pay | Admitting: Hematology & Oncology

## 2011-05-16 VITALS — BP 110/69 | HR 70 | Temp 97.4°F | Ht 76.0 in | Wt 263.0 lb

## 2011-05-16 DIAGNOSIS — I4891 Unspecified atrial fibrillation: Secondary | ICD-10-CM

## 2011-05-16 DIAGNOSIS — Z8672 Personal history of thrombophlebitis: Secondary | ICD-10-CM

## 2011-05-16 DIAGNOSIS — I87009 Postthrombotic syndrome without complications of unspecified extremity: Secondary | ICD-10-CM

## 2011-05-16 DIAGNOSIS — R894 Abnormal immunological findings in specimens from other organs, systems and tissues: Secondary | ICD-10-CM

## 2011-05-16 DIAGNOSIS — D689 Coagulation defect, unspecified: Secondary | ICD-10-CM

## 2011-05-16 DIAGNOSIS — I82409 Acute embolism and thrombosis of unspecified deep veins of unspecified lower extremity: Secondary | ICD-10-CM

## 2011-05-16 DIAGNOSIS — D6862 Lupus anticoagulant syndrome: Secondary | ICD-10-CM

## 2011-05-16 DIAGNOSIS — R609 Edema, unspecified: Secondary | ICD-10-CM

## 2011-05-16 HISTORY — DX: Lupus anticoagulant syndrome: D68.62

## 2011-05-16 LAB — PROTIME-INR (CHCC SATELLITE)

## 2011-05-16 NOTE — Progress Notes (Signed)
CC:   Dario Guardian, M.D. Tonye Royalty, MD  DIAGNOSES: 1. Deep venous thrombosis of the right leg. 2. Lupus anticoagulant. 3. Chronic post phlebitic syndrome.  CURRENT THERAPY:  Coumadin 10 mg p.o. daily, lifelong.  INTERIM HISTORY:  [name] comes in for followup.  We last saw him back in April.  I just feel bad for him.  He and his family are still having financial issues.  I can tell this really is affecting him.  He really just did not want to talk that much today.  To make matters worse, he got a letter from [facility] saying that he needed to pay his bills.  I really just feel bad that he has to deal with that issue.  He is trying his best.  He is working.  It is hard to say how much money he is able to really save given all of the other expresses that he has.  He still has issues with the right leg and post phlebitic syndrome.  He is being seen by Dr. Wyline Beady for, I think, pain issues.  He has had no bleeding.  His Coumadin has not been checked since we last saw him.  He has had no cough or shortness breath.  He has had no change in bowel or bladder habits.  He has not noticed any kind of rashes.  PHYSICAL EXAMINATION:  General Appearance:  This is a well-developed, well-nourished, white gentleman in no obvious distress.  Vital Signs: Temperature 97.4.  Pulse 70.  Respiratory rate 18.  Blood pressure 110/69.  Weight is 263.  Head and Neck Exam:  A normocephalic, atraumatic skull.  There are no ocular or oral lesions.  There are no palpable cervical or supraclavicular lymph nodes.  Lungs:  Clear bilaterally.  Cardiac Exam:  Regular rhythm with a normal S1 and S2. There are no murmurs, rubs, or bruits.  Abdominal Exam:  Soft with good bowel sounds.  There is no palpable abdominal mass.  There is no fluid wave.  There is no palpable hepatosplenomegaly.  Back Exam:  No tenderness over the spine, ribs, or hips.  Extremities:  No clubbing, cyanosis, or edema.   He does have chronic nonpitting edema of the right leg.  This is appears unchanged from his prior exam.  Skin Exam:  Some slight plethora in the right lower leg.  LABORATORY STUDIES:  INR is 1.9.  IMPRESSION:  Lee Phillips is a 51 year old gentleman with a history of recurrent deep venous thrombosis of the right leg.  He does have lupus anticoagulant.  I recommended that he start baby aspirin in addition to the Coumadin.  I think with lupus anticoagulant this may provide a little bit of extra help for him.  It is clearly a financial struggle for him to come see [place].  I really think that we only need to see him as necessary.  He has not been seen in 10 months and he seems to be doing the same.  I just do not think we are doing much for him right now.  I just do not seeing [place] being able to get him off of blood thinner.  I do not think we should get him on to the more expensive blood thinners as this would be more of a financial hardship for him.  I told him that he could call us anytime that he has difficulties.  I told him to take the baby aspirin with food once a day.  ______________________________ Josph Macho, M.D. PRE/MEDQ  D:  05/16/2011  T:  05/16/2011  Job:  [id number]

## 2011-05-16 NOTE — Progress Notes (Signed)
This office note has been dictated.

## 2011-05-17 NOTE — Letter (Signed)
May 16, 2011    Dario Guardian, M.D. [address] [place], [place] [address]  NAME:  NEEL, BUFFONE MRN:  [id number] DOB:  [birth year]  Dear Sonny Masters:  I am writing you a letter regarding Mr. Ouida Sills. I was wondering if you might be able to help me out with him. It is clearly very difficult for him to come see me for financial reasons. I actually have not seen him for 10 months. I do not think that he has had his Coumadin checked in 10 months. When we saw him today, his INR was 1.9.  I was wondering if there was anyway you might be able to see him, or at least have blood checked every 3 to 4 months. I think this would be reasonable followup for him, as he is on Coumadin.  Mr. Frith clearly is under a lot of financial stress, and I just do not want to add to that by having him come all the way out to see me when he probably could just have blood drawn by your office.  I would be more than happy to help out with the INR adjustment if necessary.  I did put Mr. Gutterman on low-dose aspirin today. I told him to take 81 mg.  He does have lupus anticoagulant, so I think a baby aspirin would not be a bad idea for him.  He still has the post-phlebitic issues with his right leg.  If there is any questions or issues regarding Mr. Derrell Lolling, please feel free to let me know.  I hope you and your family will have a nice year. I know you are staying busy. I am sure that you are managing this new EPIC system much better than I am and hope that it does not cause too much stress for your office.  If I can be of any help for any of your patients, please feel free to let me know at [phone].  With best personal regards,    Josph Macho, M.D.  PRE/MEDQ  D:  05/16/2011  T:  05/16/2011  Job:  [phone]

## 2011-05-25 ENCOUNTER — Telehealth: Payer: Self-pay | Admitting: Hematology & Oncology

## 2011-05-25 NOTE — Telephone Encounter (Signed)
Faxed personal letter to Dr. Hulan Fess office per Dr. Gustavo Lah request.

## 2011-09-19 ENCOUNTER — Other Ambulatory Visit: Payer: Self-pay | Admitting: Hematology & Oncology

## 2012-03-04 ENCOUNTER — Other Ambulatory Visit: Payer: Self-pay | Admitting: Hematology & Oncology

## 2012-09-06 ENCOUNTER — Other Ambulatory Visit: Payer: Self-pay | Admitting: Hematology & Oncology

## 2013-03-10 ENCOUNTER — Other Ambulatory Visit: Payer: Self-pay | Admitting: Hematology & Oncology

## 2013-09-16 ENCOUNTER — Telehealth: Payer: Self-pay | Admitting: Hematology & Oncology

## 2013-09-16 NOTE — Telephone Encounter (Signed)
Pt left message wants to see MD. I left him message to call back.

## 2013-09-18 ENCOUNTER — Other Ambulatory Visit: Payer: Self-pay | Admitting: *Deleted

## 2013-09-18 DIAGNOSIS — Z8672 Personal history of thrombophlebitis: Secondary | ICD-10-CM

## 2013-09-19 ENCOUNTER — Ambulatory Visit (HOSPITAL_BASED_OUTPATIENT_CLINIC_OR_DEPARTMENT_OTHER): Payer: 59 | Admitting: Hematology & Oncology

## 2013-09-19 ENCOUNTER — Telehealth: Payer: Self-pay | Admitting: Hematology & Oncology

## 2013-09-19 ENCOUNTER — Encounter: Payer: Self-pay | Admitting: Hematology & Oncology

## 2013-09-19 ENCOUNTER — Other Ambulatory Visit (HOSPITAL_BASED_OUTPATIENT_CLINIC_OR_DEPARTMENT_OTHER): Payer: 59 | Admitting: Lab

## 2013-09-19 VITALS — BP 119/73 | HR 63 | Temp 98.2°F | Resp 18 | Ht 76.0 in | Wt 261.0 lb

## 2013-09-19 DIAGNOSIS — I82509 Chronic embolism and thrombosis of unspecified deep veins of unspecified lower extremity: Secondary | ICD-10-CM

## 2013-09-19 DIAGNOSIS — M329 Systemic lupus erythematosus, unspecified: Secondary | ICD-10-CM

## 2013-09-19 DIAGNOSIS — Z8672 Personal history of thrombophlebitis: Secondary | ICD-10-CM

## 2013-09-19 DIAGNOSIS — D6862 Lupus anticoagulant syndrome: Secondary | ICD-10-CM

## 2013-09-19 LAB — PROTIME-INR (CHCC SATELLITE)
INR: 1.5 — AB (ref 2.0–3.5)
PROTIME: 18 s — AB (ref 10.6–13.4)

## 2013-09-19 MED ORDER — WARFARIN SODIUM 10 MG PO TABS: 10.0000 mg | ORAL_TABLET | Freq: Once | ORAL | Status: DC

## 2013-09-19 NOTE — Telephone Encounter (Signed)
Left message with 7-7 lab

## 2013-09-20 NOTE — Progress Notes (Signed)
Hematology and Oncology Follow Up Visit  WAYMON LASER [id number] [birth year] 53 y.o. 09/20/2013   Principle Diagnosis:    left lower extremity DVT  Post phlebitic syndrome of the left leg  Lupus anticoagulant  Current Therapy:    Coumadin-lifelong to maintain INR of about 2     Interim History:  Mr.  Straus is back for long awaited followup. Has been over 2 years since we saw him. Apparently, he is running out of his Coumadin.  He is doing okay although he just lost his job. He does the company for 31 years.  as always, the job went to [place]. Hopefully, he will find another job.  He's been doing fairly well. He's not had a lot of issues with post phlebitic problems of the left leg. He is on OxyContin and hydrocodone. This seems to help him.  He's had no bleeding.  He says that he's been taking half a Coumadin dose because he has been running out of the Coumadin.  He has had no cough or shortness of breath. There's been no chest wall pain.  Medications: Current outpatient prescriptions:azelastine (ASTELIN) 137 MCG/SPRAY nasal spray, Place 137 mcg into the nose Twice daily., Disp: , Rfl: ;  HYDROcodone-acetaminophen (NORCO) 10-325 MG per tablet, Take 10-325 mg by mouth Every 4 hours., Disp: , Rfl: ;  lisinopril-hydrochlorothiazide (PRINZIDE,ZESTORETIC) 20-12.5 MG per tablet, Take 20 mg by mouth Daily., Disp: , Rfl:  metoprolol (LOPRESSOR) 50 MG tablet, Take 50 mg by mouth Twice daily., Disp: , Rfl: ;  OXYCONTIN 40 MG 12 hr tablet, Take 40 mg by mouth Twice daily., Disp: , Rfl: ;  warfarin (COUMADIN) 10 MG tablet, Take 1 tablet (10 mg total) by mouth one time only at 6 PM., Disp: 30 tablet, Rfl: 12  Allergies: No Known Allergies  Past Medical History, Surgical history, Social history, and Family History were reviewed and updated.  Review of Systems: As above  Physical Exam:  height is 6\' 4"  (1.93 m) and weight is 261 lb (118.389 kg). His oral temperature is 98.2 F (36.8 C).  His blood pressure is 119/73 and his pulse is 63. His respiration is 18.   Well-developed and well-nourished white woman. Lungs are clear. Cardiac exam regular in rhythm. Abdomen is soft. Has good bowel sounds. There is no fluid wave. There is no palpable liver or spleen tip. Abdomen is soft. Has good bowel sounds. There are no palpable liver or spleen tip. Back exam shows no tenderness over the spine ribs or hips. Extremities shows some mild nonpitting edema of the left leg. No venous cord is noted. He has some stasis dermatitis changes in the left leg. Right leg is unremarkable. Skin exam shows no rashes outside of the changes in the left leg.  Lab Results  Component Value Date   WBC 5.6 07/14/2010   HGB 13.0 07/14/2010   HCT 37.0* 07/14/2010   MCV 78* 07/14/2010   PLT 191 07/14/2010     Chemistry   No results found for this basename: NA, K, CL, CO2, BUN, CREATININE, GLU   No results found for this basename: CALCIUM, ALKPHOS, AST, ALT, BILITOT         Impression and Plan: Mr. Navarrete is 53 year old gentleman. He has a long history of DVT of the left leg. I have been seeing him probably for 10 years.  We will go ahead and we will re- fill the Coumadin. I told about all he would have to do is to give me  a call and we could do this.  I will just check his PT/INR in 3 weeks. I think that as long as he has the INR around 2, this will be okay. He is not sure when his last Coumadin level was checked.  He can always call us with any problems.       Volanda Napoleon, MD 6/19/20156:49 AM

## 2013-10-08 ENCOUNTER — Other Ambulatory Visit (HOSPITAL_BASED_OUTPATIENT_CLINIC_OR_DEPARTMENT_OTHER): Payer: 59 | Admitting: Lab

## 2013-10-08 DIAGNOSIS — I82509 Chronic embolism and thrombosis of unspecified deep veins of unspecified lower extremity: Secondary | ICD-10-CM

## 2013-10-08 DIAGNOSIS — D6862 Lupus anticoagulant syndrome: Secondary | ICD-10-CM

## 2013-10-08 LAB — PROTIME-INR (CHCC SATELLITE)
INR: 2.2 (ref 2.0–3.5)
Protime: 26.4 Seconds — ABNORMAL HIGH (ref 10.6–13.4)

## 2013-11-02 ENCOUNTER — Encounter: Payer: Self-pay | Admitting: *Deleted

## 2014-07-10 ENCOUNTER — Ambulatory Visit (HOSPITAL_COMMUNITY)
Admission: RE | Admit: 2014-07-10 | Discharge: 2014-07-10 | Disposition: A | Discharge status: Home / Self Care | Payer: 59 | Source: Ambulatory Visit | Attending: Family Medicine | Admitting: Family Medicine

## 2014-07-10 ENCOUNTER — Other Ambulatory Visit (HOSPITAL_COMMUNITY): Payer: Self-pay | Admitting: Family Medicine

## 2014-07-10 DIAGNOSIS — I4891 Unspecified atrial fibrillation: Secondary | ICD-10-CM | POA: Insufficient documentation

## 2014-07-10 DIAGNOSIS — I10 Essential (primary) hypertension: Secondary | ICD-10-CM | POA: Diagnosis present

## 2014-07-10 DIAGNOSIS — I82441 Acute embolism and thrombosis of right tibial vein: Secondary | ICD-10-CM | POA: Diagnosis present

## 2014-07-10 DIAGNOSIS — Z87891 Personal history of nicotine dependence: Secondary | ICD-10-CM | POA: Insufficient documentation

## 2014-07-10 DIAGNOSIS — Z86718 Personal history of other venous thrombosis and embolism: Secondary | ICD-10-CM

## 2014-07-10 DIAGNOSIS — I2699 Other pulmonary embolism without acute cor pulmonale: Principal | ICD-10-CM | POA: Diagnosis present

## 2014-07-10 DIAGNOSIS — D6862 Lupus anticoagulant syndrome: Secondary | ICD-10-CM | POA: Diagnosis present

## 2014-07-10 DIAGNOSIS — R5081 Fever presenting with conditions classified elsewhere: Secondary | ICD-10-CM | POA: Diagnosis present

## 2014-07-10 DIAGNOSIS — I82431 Acute embolism and thrombosis of right popliteal vein: Secondary | ICD-10-CM | POA: Diagnosis present

## 2014-07-10 DIAGNOSIS — J9811 Atelectasis: Secondary | ICD-10-CM | POA: Diagnosis present

## 2014-07-10 DIAGNOSIS — Z7901 Long term (current) use of anticoagulants: Secondary | ICD-10-CM

## 2014-07-10 DIAGNOSIS — Z79891 Long term (current) use of opiate analgesic: Secondary | ICD-10-CM

## 2014-07-10 DIAGNOSIS — R0600 Dyspnea, unspecified: Secondary | ICD-10-CM

## 2014-07-10 DIAGNOSIS — Z79899 Other long term (current) drug therapy: Secondary | ICD-10-CM

## 2014-07-11 ENCOUNTER — Encounter (HOSPITAL_COMMUNITY): Payer: Self-pay

## 2014-07-11 ENCOUNTER — Emergency Department (HOSPITAL_COMMUNITY): Payer: 59

## 2014-07-11 ENCOUNTER — Inpatient Hospital Stay (HOSPITAL_COMMUNITY)
Admission: EM | Admit: 2014-07-11 | Discharge: 2014-07-18 | DRG: 176 | Disposition: A | Discharge status: Home / Self Care | Payer: 59 | Attending: Family Medicine | Admitting: Family Medicine

## 2014-07-11 DIAGNOSIS — Z86718 Personal history of other venous thrombosis and embolism: Secondary | ICD-10-CM | POA: Diagnosis not present

## 2014-07-11 DIAGNOSIS — I482 Chronic atrial fibrillation: Secondary | ICD-10-CM | POA: Diagnosis not present

## 2014-07-11 DIAGNOSIS — D6862 Lupus anticoagulant syndrome: Secondary | ICD-10-CM | POA: Diagnosis present

## 2014-07-11 DIAGNOSIS — R7989 Other specified abnormal findings of blood chemistry: Secondary | ICD-10-CM | POA: Diagnosis not present

## 2014-07-11 DIAGNOSIS — D696 Thrombocytopenia, unspecified: Secondary | ICD-10-CM | POA: Diagnosis present

## 2014-07-11 DIAGNOSIS — R778 Other specified abnormalities of plasma proteins: Secondary | ICD-10-CM | POA: Diagnosis present

## 2014-07-11 DIAGNOSIS — I82401 Acute embolism and thrombosis of unspecified deep veins of right lower extremity: Secondary | ICD-10-CM | POA: Diagnosis not present

## 2014-07-11 DIAGNOSIS — I2699 Other pulmonary embolism without acute cor pulmonale: Secondary | ICD-10-CM | POA: Diagnosis present

## 2014-07-11 DIAGNOSIS — I4891 Unspecified atrial fibrillation: Secondary | ICD-10-CM | POA: Diagnosis present

## 2014-07-11 DIAGNOSIS — R5081 Fever presenting with conditions classified elsewhere: Secondary | ICD-10-CM | POA: Diagnosis present

## 2014-07-11 DIAGNOSIS — R0602 Shortness of breath: Secondary | ICD-10-CM

## 2014-07-11 DIAGNOSIS — I82431 Acute embolism and thrombosis of right popliteal vein: Secondary | ICD-10-CM | POA: Diagnosis present

## 2014-07-11 DIAGNOSIS — I10 Essential (primary) hypertension: Secondary | ICD-10-CM

## 2014-07-11 DIAGNOSIS — Z7901 Long term (current) use of anticoagulants: Secondary | ICD-10-CM

## 2014-07-11 DIAGNOSIS — Z87891 Personal history of nicotine dependence: Secondary | ICD-10-CM | POA: Diagnosis not present

## 2014-07-11 DIAGNOSIS — Z8672 Personal history of thrombophlebitis: Secondary | ICD-10-CM

## 2014-07-11 DIAGNOSIS — I82441 Acute embolism and thrombosis of right tibial vein: Secondary | ICD-10-CM | POA: Diagnosis present

## 2014-07-11 DIAGNOSIS — Z79891 Long term (current) use of opiate analgesic: Secondary | ICD-10-CM | POA: Diagnosis not present

## 2014-07-11 DIAGNOSIS — Z79899 Other long term (current) drug therapy: Secondary | ICD-10-CM | POA: Diagnosis not present

## 2014-07-11 DIAGNOSIS — J9811 Atelectasis: Secondary | ICD-10-CM | POA: Diagnosis present

## 2014-07-11 HISTORY — DX: Lymphedema, not elsewhere classified: I89.0

## 2014-07-11 HISTORY — DX: Unspecified atrial fibrillation: I48.91

## 2014-07-11 HISTORY — DX: Essential (primary) hypertension: I10

## 2014-07-11 LAB — APTT: APTT: 80 s — AB (ref 24–37)

## 2014-07-11 LAB — CBC WITH DIFFERENTIAL/PLATELET
BASOS PCT: 0 % (ref 0–1)
Basophils Absolute: 0 10*3/uL (ref 0.0–0.1)
EOS PCT: 1 % (ref 0–5)
Eosinophils Absolute: 0.1 10*3/uL (ref 0.0–0.7)
HCT: 43.2 % (ref 39.0–52.0)
HEMOGLOBIN: 14.3 g/dL (ref 13.0–17.0)
LYMPHS ABS: 1.9 10*3/uL (ref 0.7–4.0)
Lymphocytes Relative: 20 % (ref 12–46)
MCH: 25.9 pg — AB (ref 26.0–34.0)
MCHC: 33.1 g/dL (ref 30.0–36.0)
MCV: 78.1 fL (ref 78.0–100.0)
MONO ABS: 0.7 10*3/uL (ref 0.1–1.0)
Monocytes Relative: 7 % (ref 3–12)
NEUTROS ABS: 6.8 10*3/uL (ref 1.7–7.7)
Neutrophils Relative %: 72 % (ref 43–77)
Platelets: 75 10*3/uL — ABNORMAL LOW (ref 150–400)
RBC: 5.53 MIL/uL (ref 4.22–5.81)
RDW: 14 % (ref 11.5–15.5)
WBC: 9.5 10*3/uL (ref 4.0–10.5)

## 2014-07-11 LAB — COMPREHENSIVE METABOLIC PANEL
ALT: 26 U/L (ref 0–53)
ANION GAP: 9 (ref 5–15)
AST: 32 U/L (ref 0–37)
Albumin: 4.6 g/dL (ref 3.5–5.2)
Alkaline Phosphatase: 69 U/L (ref 39–117)
BUN: 16 mg/dL (ref 6–23)
CALCIUM: 9.6 mg/dL (ref 8.4–10.5)
CO2: 23 mmol/L (ref 19–32)
CREATININE: 0.96 mg/dL (ref 0.50–1.35)
Chloride: 103 mmol/L (ref 96–112)
GLUCOSE: 116 mg/dL — AB (ref 70–99)
Potassium: 5 mmol/L (ref 3.5–5.1)
Sodium: 135 mmol/L (ref 135–145)
Total Bilirubin: 0.8 mg/dL (ref 0.3–1.2)
Total Protein: 7.8 g/dL (ref 6.0–8.3)

## 2014-07-11 LAB — CBC
HCT: 43.8 % (ref 39.0–52.0)
HEMOGLOBIN: 14.6 g/dL (ref 13.0–17.0)
MCH: 26.2 pg (ref 26.0–34.0)
MCHC: 33.3 g/dL (ref 30.0–36.0)
MCV: 78.5 fL (ref 78.0–100.0)
Platelets: 74 10*3/uL — ABNORMAL LOW (ref 150–400)
RBC: 5.58 MIL/uL (ref 4.22–5.81)
RDW: 14.2 % (ref 11.5–15.5)
WBC: 10.6 10*3/uL — AB (ref 4.0–10.5)

## 2014-07-11 LAB — TYPE AND SCREEN
ABO/RH(D): A POS
Antibody Screen: NEGATIVE

## 2014-07-11 LAB — I-STAT TROPONIN, ED: TROPONIN I, POC: 0.09 ng/mL — AB (ref 0.00–0.08)

## 2014-07-11 LAB — PROTIME-INR
INR: 2.09 — AB (ref 0.00–1.49)
PROTHROMBIN TIME: 23.7 s — AB (ref 11.6–15.2)

## 2014-07-11 LAB — BRAIN NATRIURETIC PEPTIDE: B NATRIURETIC PEPTIDE 5: 194.5 pg/mL — AB (ref 0.0–100.0)

## 2014-07-11 LAB — TROPONIN I: Troponin I: 0.16 ng/mL — ABNORMAL HIGH (ref <0.031)

## 2014-07-11 MED ORDER — HEPARIN (PORCINE) IN NACL 100-0.45 UNIT/ML-% IJ SOLN
2000.0000 [IU]/h | INTRAMUSCULAR | Status: DC
  Administered 2014-07-11 – 2014-07-15 (×6): 2000 [IU]/h via INTRAVENOUS
  Filled 2014-07-11 (×9): qty 250

## 2014-07-11 MED ORDER — SODIUM CHLORIDE 0.9 % IV BOLUS (SEPSIS)
500.0000 mL | Freq: Once | INTRAVENOUS | Status: AC
  Administered 2014-07-11: 500 mL via INTRAVENOUS

## 2014-07-11 MED ORDER — LORAZEPAM 2 MG/ML IJ SOLN: 1.0000 mg | Freq: Once | INTRAMUSCULAR | Status: DC

## 2014-07-11 MED ORDER — MORPHINE SULFATE 4 MG/ML IJ SOLN
4.0000 mg | Freq: Once | INTRAMUSCULAR | Status: AC
  Administered 2014-07-11: 4 mg via INTRAVENOUS
  Filled 2014-07-11: qty 1

## 2014-07-11 MED ORDER — IOHEXOL 350 MG/ML SOLN
100.0000 mL | Freq: Once | INTRAVENOUS | Status: AC | PRN
  Administered 2014-07-11: 100 mL via INTRAVENOUS

## 2014-07-11 NOTE — ED Provider Notes (Signed)
CSN: [id number]     Arrival date & time 07/11/14  1459 History   First MD Initiated Contact with Patient 07/11/14 1519     Chief Complaint  Patient presents with  . Shortness of Breath     (Consider location/radiation/quality/duration/timing/severity/associated sxs/prior Treatment) HPI   Lee Phillips is a 54 y.o. male with past medical history significant for lupus anticoagulant, A. fib, right lower extremity DVT, anticoagulated with Coumadin complaining of acute onset of shortness of breath and dyspnea on exertion 2 days ago. Patient denies chest pain, increasing lower extremity edema or pain, fever, cough, nausea, vomiting, PND, orthopnea. His last INR check was 1.5 months ago. States that he has been compliant with his medications and compliant with diet restrictions.  Past Medical History  Diagnosis Date  . Lupus anticoagulant with hypercoagulable state 05/16/2011  . Hypertension   . Irregular heart beat   . A-fib   . Lymphedema    History reviewed. No pertinent past surgical history. Family History  Problem Relation Age of Onset  . Colon cancer Father   . Skin cancer Mother    History  Substance Use Topics  . Smoking status: Former Smoker -- 1.50 packs/day for 5 years    Types: Cigarettes    Start date: 05/22/1973    Quit date: 07/21/1978  . Smokeless tobacco: Never Used     Comment: quit 35 years ago  . Alcohol Use: No    Review of Systems   10 systems reviewed and found to be negative, except as noted in the HPI.   Allergies  Review of patient's allergies indicates no known allergies.  Home Medications   Prior to Admission medications   Medication Sig Start Date End Date Taking? Authorizing Provider  fluticasone (FLONASE) 50 MCG/ACT nasal spray Place 1 spray into both nostrils daily.   Yes Historical Provider, MD  HYDROcodone-acetaminophen (NORCO) 10-325 MG per tablet Take 10-325 mg by mouth Every 4 hours. 04/18/11  Yes Historical Provider, MD   lisinopril-hydrochlorothiazide (PRINZIDE,ZESTORETIC) 20-12.5 MG per tablet Take 20 mg by mouth Daily. 03/31/11  Yes Historical Provider, MD  metoprolol (LOPRESSOR) 50 MG tablet Take 50 mg by mouth Twice daily. 04/11/11  Yes Historical Provider, MD  Multiple Vitamins-Minerals (MULTI-VITAMIN GUMMIES PO) Take 2 tablets by mouth daily.   Yes Historical Provider, MD  OXYCONTIN 40 MG 12 hr tablet Take 40 mg by mouth Twice daily. 05/13/11  Yes Historical Provider, MD  triamcinolone (NASACORT) 55 MCG/ACT AERO nasal inhaler Place 1 spray into the nose daily.   Yes Historical Provider, MD  warfarin (COUMADIN) 10 MG tablet Take 1 tablet (10 mg total) by mouth one time only at 6 PM. Patient taking differently: Take 10 mg by mouth daily.  09/19/13  Yes Volanda Napoleon, MD   BP 136/91 mmHg  Pulse 107  Temp(Src) 98.1 F (36.7 C) (Oral)  Resp 18  SpO2 98% Physical Exam  Constitutional: He is oriented to person, place, and time. He appears well-developed and well-nourished. No distress.  HENT:  Head: Normocephalic.  Mouth/Throat: Oropharynx is clear and moist.  Eyes: Conjunctivae and EOM are normal. Pupils are equal, round, and reactive to light.  Cardiovascular: Normal rate.   Pulmonary/Chest: Effort normal and breath sounds normal. No stridor. No respiratory distress. He has no wheezes. He has no rales. He exhibits no tenderness.  Musculoskeletal: Normal range of motion. He exhibits edema and tenderness.  Right lower extremity swollen, hyperpigmented, diffusely tender to palpation, intact distal pulses.  Neurological: He  is alert and oriented to person, place, and time.  Psychiatric: He has a normal mood and affect.  Nursing note and vitals reviewed.   ED Course  Procedures (including critical care time)  CRITICAL CARE Performed by: Monico Blitz   Total critical care time: 45  Critical care time was exclusive of separately billable procedures and treating other patients.  Critical care was  necessary to treat or prevent imminent or life-threatening deterioration.  Critical care was time spent personally by me on the following activities: development of treatment plan with patient and/or surrogate as well as nursing, discussions with consultants, evaluation of patient's response to treatment, examination of patient, obtaining history from patient or surrogate, ordering and performing treatments and interventions, ordering and review of laboratory studies, ordering and review of radiographic studies, pulse oximetry and re-evaluation of patient's condition.  Labs Review Labs Reviewed - No data to display  Imaging Review Dg Chest 2 View  07/10/2014   CLINICAL DATA:  Shortness of breath with exertion for 3 days, history hypertension, atrial fibrillation, former smoker, hypercoagulable with lupus anticoagulant  EXAM: CHEST  2 VIEW  COMPARISON:  01/11/2005  FINDINGS: Upper normal heart size.  Normal mediastinal contours and pulmonary vascularity.  Minimal chronic peribronchial thickening.  No acute infiltrate, pleural effusion or pneumothorax.  Bones unremarkable.  IMPRESSION: Minimal chronic bronchitic changes without infiltrate.   Electronically Signed   By: Lavonia Dana M.D.   On: 07/10/2014 18:32     EKG Interpretation   Date/Time:  Friday July 11 2014 15:41:51 EDT Ventricular Rate:  97 PR Interval:  141 QRS Duration: 82 QT Interval:  321 QTC Calculation: 408 R Axis:   71 Text Interpretation:  Sinus rhythm Abnormal T, consider ischemia, anterior  leads Baseline wander in lead(s) V1 New TWI in leads V2, V3, V4 Confirmed  by DOCHERTY  MD, MEGAN [phone]) on 07/11/2014 3:46:12 PM      MDM   Final diagnoses:  Pulmonary embolism  Elevated troponin  Chronic anticoagulation  Thrombocytopenia  Atrial fibrillation, unspecified    Filed Vitals:   07/11/14 1512 07/11/14 1712 07/11/14 1723 07/11/14 1905  BP: 136/91 112/78  145/84  Pulse: 107 92  95  Temp: 98.1 F (36.7 C) 98.5 F  (36.9 C)    TempSrc: Oral Oral    Resp: 18 14  16   Weight:   268 lb (121.564 kg)   SpO2: 98% 93%  94%    Medications  heparin ADULT infusion 100 units/mL (25000 units/250 mL) (2,000 Units/hr Intravenous New Bag/Given 07/11/14 1812)  sodium chloride 0.9 % bolus 500 mL (0 mLs Intravenous Stopped 07/11/14 1656)  iohexol (OMNIPAQUE) 350 MG/ML injection 100 mL (100 mLs Intravenous Contrast Given 07/11/14 1635)    Lee Phillips is a pleasant 54 y.o. male presenting with new onset shortness of breath and dyspnea on exertion 2 days ago. Patient denies chest pain, he is mildly tachycardic. Patient has a history of lupus anticoagulant, right lower extremity DVT, he is anticoagulated with Coumadin. Compliant with his medications and dietary restrictions. Patient was evaluated by primary care, had a chest x-ray today with no infiltrate. Since the ED for further evaluation. Patient has a small bump in his i-STAT troponin: 0.09. KG shows new anterior T-wave inversions. His INR is 2.09. There is a question as to whether this is therapeutic considering his lupus anticoagulant.  Critical values of large clot burden and central PE verbally called from radiologist. He notes that there is also a right heart strain  and encouraged me to call a code PE.  Case discussed with intensivist Dr. Elsworth Soho: He thinks that the patient's INR should be higher considering his lupus: Recommend starting heparin. He recommends admission via triad hospitalist to a step down bed at [facility] cone. States that he defers lysis at this time.  Case discussed with pharmacist Stanton Kidney: She will start heparin at 56. Recommends not bolusing him. She is concerned because his platelets are low at 75. I have asked her to discuss this with Dr. Elsworth Soho.  Case discussed with triad hospitalist Dr. Roel Cluck. She will admit the patient and transfer to [facility], she requests that we consult hematology for more guidance on what the target INR should be.  Heme on  consult from Dr. Burr Medico, states that the low platelets may be from a consumption secondary to thrombosis.Roel Cluck will speak directly with her.   Monico Blitz, PA-C 07/11/14 1946  Ernestina Patches, MD 07/12/14 0030

## 2014-07-11 NOTE — ED Notes (Signed)
Patient transported to CT 

## 2014-07-11 NOTE — ED Notes (Signed)
PA made aware of patient i-stat troponin result.

## 2014-07-11 NOTE — ED Notes (Signed)
Pt with shortness of breath x 2 days.  Pt has hx of PE x 6 months ago. Continued on coumadin.  Pt had cxr completed showing bronchitis here yesterday.  Primary said to come here for worsening

## 2014-07-11 NOTE — Consult Note (Signed)
Lee Phillips   DOB:07-24-1960   AY#:[id number]   ATF#:[id number]  Subjective: Patient is a 54 year old male, with past medical history of DVT in the lupus anticoagulant, hypertension, A. fib, who was admitted for massive pulmonary embolization. Patient is my partner Dr. Marge Phillips patient, and I was called to see patient regarding his newly developed PE.   Objective:  Filed Vitals:   07/11/14 2300  BP: 141/89  Pulse: 85  Temp:   Resp: 17    Body mass index is 32.64 kg/(m^2). No intake or output data in the 24 hours ending 07/11/14 2347   Sclerae unicteric  Oropharynx clear  No peripheral adenopathy  Lungs clear -- no rales or rhonchi  Heart regular rate and rhythm  Abdomen benign  MSK no focal spinal tenderness, no peripheral edema  Neuro nonfocal Skin: (+) right leg edema (chronic) and skin pigmentation    CBG (last 3)  No results for input(s): GLUCAP in the last 72 hours.   Labs:  Lab Results  Component Value Date   WBC 10.6* 07/11/2014   HGB 14.6 07/11/2014   HCT 43.8 07/11/2014   MCV 78.5 07/11/2014   PLT 74* 07/11/2014   NEUTROABS 6.8 07/11/2014    @LASTCHEMISTRY @  Urine Studies No results for input(s): UHGB, CRYS in the last 72 hours.  Invalid input(s): UACOL, UAPR, USPG, UPH, UTP, UGL, UKET, UBIL, UNIT, UROB, ULEU, UEPI, UWBC, URBC, UBAC, CAST, UCOM, BILUA  Basic Metabolic Panel:  Recent Labs Lab 07/11/14 1605  NA 135  K 5.0  CL 103  CO2 23  GLUCOSE 116*  BUN 16  CREATININE 0.96  CALCIUM 9.6   GFR Estimated Creatinine Clearance: 123.8 mL/min (by C-G formula based on Cr of 0.96). Liver Function Tests:  Recent Labs Lab 07/11/14 1605  AST 32  ALT 26  ALKPHOS 69  BILITOT 0.8  PROT 7.8  ALBUMIN 4.6   No results for input(s): LIPASE, AMYLASE in the last 168 hours. No results for input(s): AMMONIA in the last 168 hours. Coagulation profile  Recent Labs Lab 07/11/14 1605  INR 2.09*    CBC:  Recent Labs Lab 07/11/14 1605  07/11/14 1920  WBC 9.5 10.6*  NEUTROABS 6.8  --   HGB 14.3 14.6  HCT 43.2 43.8  MCV 78.1 78.5  PLT 75* 74*   Cardiac Enzymes:  Recent Labs Lab 07/11/14 1920  TROPONINI 0.16*   BNP: Invalid input(s): POCBNP CBG: No results for input(s): GLUCAP in the last 168 hours. D-Dimer No results for input(s): DDIMER in the last 72 hours. Hgb A1c No results for input(s): HGBA1C in the last 72 hours. Lipid Profile No results for input(s): CHOL, HDL, LDLCALC, TRIG, CHOLHDL, LDLDIRECT in the last 72 hours. Thyroid function studies No results for input(s): TSH, T4TOTAL, T3FREE, THYROIDAB in the last 72 hours.  Invalid input(s): FREET3 Anemia work up No results for input(s): VITAMINB12, FOLATE, FERRITIN, TIBC, IRON, RETICCTPCT in the last 72 hours. Microbiology No results found for this or any previous visit (from the past 240 hour(s)).    Studies:  Dg Chest 2 View  07/10/2014   CLINICAL DATA:  Shortness of breath with exertion for 3 days, history hypertension, atrial fibrillation, former smoker, hypercoagulable with lupus anticoagulant  EXAM: CHEST  2 VIEW  COMPARISON:  01/11/2005  FINDINGS: Upper normal heart size.  Normal mediastinal contours and pulmonary vascularity.  Minimal chronic peribronchial thickening.  No acute infiltrate, pleural effusion or pneumothorax.  Bones unremarkable.  IMPRESSION: Minimal chronic bronchitic changes without infiltrate.  Electronically Signed   By: Lee Phillips M.D.   On: 07/10/2014 18:32   Ct Angio Chest Pe W/cm &/or Wo Cm  07/11/2014   CLINICAL DATA:  Two day history of shortness of breath  EXAM: CT ANGIOGRAPHY CHEST WITH CONTRAST  TECHNIQUE: Multidetector CT imaging of the chest was performed using the standard protocol during bolus administration of intravenous contrast. Multiplanar CT image reconstructions and MIPs were obtained to evaluate the vascular anatomy.  CONTRAST:  135mL OMNIPAQUE IOHEXOL 350 MG/ML SOLN  COMPARISON:  Chest radiograph Aug 09, 2014  FINDINGS: There is extensive pulmonary embolus bilaterally arising from both main pulmonary arteries and extending into multiple upper and lower lobe pulmonary artery branches bilaterally. There is right heart strain with right ventricle to left ventricle ratio of 1.42, normal less than 0.9.  There is no appreciable thoracic aortic aneurysm or dissection.  There is patchy atelectasis in both lower lobes. There is a wedge-shaped defect in the posterior right base, suspicious for focal infarct. There is mild patchy infiltrate in the superior segment of the right lower lobe, possibly a second infarct.  There is no appreciable thoracic adenopathy. The pericardium is not thickened. Visualized thyroid appears normal.  In the visualized upper abdomen, no lesions are identified.  There are no blastic or lytic bone lesions.  Review of the MIP images confirms the above findings.  IMPRESSION: Extensive central pulmonary emboli. Positive for acute PE with CT evidence of right heart strain (RV/LV Ratio = 1.42) consistent with at least submassive (intermediate risk) PE. The presence of right heart strain has been associated with an increased risk of morbidity and mortality. Please activate Code PE by paging [phone].  Probable infarct posterior right base. Question second infarct superior segment right lower lobe. Areas of patchy atelectatic change bilaterally.  No demonstrable adenopathy.  Critical Value/emergent results were called by telephone at the time of interpretation on 07/11/2014 at 5:00 pm to [facility] , who verbally acknowledged these results.   Electronically Signed   By: Lee Phillips III M.D.   On: 07/11/2014 17:00    Assessment: 54 y.o. Caucasian male with past medical history of right lower extremity DVT and lupus anticoagulant, who presented with unprovoked extensive central pulmonary emboli with evidence of right heart strain and elevated troponin, in the setting of therapeutic  Coumadin with INR 2. He was also found to have mild some cytopenia.  Recommendations: -Giving the large central PE, right heart strain and elevated troponin, I agree with stepdown unit observation -Hold Coumadin, continue heparin drip, monitor APTT closely  -Doppler of lower extremities to evaluate DVT -thrombocytopenia is possibly related to acute PE. Need to be watched closely, and it is a risk factor for bleeding when he is on anticoagulants. -IVC filter is only indicated if he has extensive lower extremity DVT, or he develops bleeding on anticoagulant -I recommend switch him to Lovenox injection or anti-Xa inhibitor on discharge, than coumadin, due to his failure on coumadin -No need to repeat a hypercoagulation workup, he needs lifelong anticoagulant if no contraindications.  His primary hematologist Dr. Carolin Guernsey will follow him on Monday. Dr. Julien Nordmann is on this weekend.   Truitt Merle, MD 07/11/2014  11:47 PM

## 2014-07-11 NOTE — Progress Notes (Signed)
ANTICOAGULATION CONSULT NOTE - Initial Consult  Pharmacy Consult for Heparin Indication: acute pulmonary embolus  No Known Allergies  Patient Measurements: Weight: 268 lb (121.564 kg)  Height: 76 inches IBW: 87 kg Heparin Dosing Weight: 112 kg  Vital Signs: Temp: 98.5 F (36.9 C) (04/08 1712) Temp Source: Oral (04/08 1712) BP: 112/78 mmHg (04/08 1712) Pulse Rate: 92 (04/08 1712)  Labs:  Recent Labs  07/11/14 1605  HGB 14.3  HCT 43.2  PLT 75*  APTT 80*  LABPROT 23.7*  INR 2.09*  CREATININE 0.96    CrCl cannot be calculated (Unknown ideal weight.).   Medical History: Past Medical History  Diagnosis Date  . Lupus anticoagulant with hypercoagulable state 05/16/2011  . Hypertension   . Irregular heart beat   . A-fib   . Lymphedema     Medications:  Scheduled:  Infusions:  PRN:   Assessment: Pt is a 54 y.o. male with past medical history significant for lupus anticoagulant, A. fib, right lower extremity DVT, anticoagulated with Coumadin complaining of acute onset of shortness of breath and dyspnea on exertion 2 days ago. CT chest showed extensive PE. Pt was on Warfarin 10mg  daily PTA with last dose today and INR=2.09. PLT=75. Discussed with Dr. Elsworth Soho and he wants to continue at heparin 18 units/kg/hr (2000 units/hr) and no bolus.  Goal of Therapy:  Heparin level 0.3-0.7 units/ml Monitor platelets by anticoagulation protocol: Yes   Plan:  No heparin bolus and start at 2000 units/hr as discussed with ER PA and Dr. Elsworth Soho Will check 6 hr HL and CBC (since baseline PLT=75>>MD aware) Will check daily HL and CBC while on heparin Continue to monitor H&H and platelets  Judie Bonus Audrea Muscat, PharmD 07/11/2014,5:33 PM

## 2014-07-11 NOTE — H&P (Signed)
PCP: Reginia Naas, MD  Hematology [facility]  Chief Complaint:  Shortness of breath  HPI: Lee Phillips is a 54 y.o. male   has a past medical history of Lupus anticoagulant with hypercoagulable state (05/16/2011); Hypertension; Irregular heart beat; A-fib; and Lymphedema.   Patient has history of remote DVT (2008) in a setting of Lupus anticoagulant in atrial fibrillation on chronic Coumadin. He is being followed by hematology. Last INR check was 6 months ago but not sure.  Dr. Marin Olp follows his INR. Patient reports 2 week hx of shortness of breath with walking up the stairs and started to lower his metoprolol dose thinking it would help. He has been only taking 1/2 or a 1/4 tab a day.  Patient 2 days ago developed shortness of breath. He noted increased dyspnea with exertion. Denies any chest pain, reports right leg swollen but unchanged. No trips. Reports being active. He presented to his primary care provider will obtain chest x-ray which was unremarkable. Patient was sent to emergency department. CT Angio showed extensive central pulmonary embolism with evidence of right heart strain. Code has been activated. Emergency department physician spoke to Dr. Elsworth Soho with PCCM who recommends transfer to [facility] to step down bed. Pharmacy has been cold and heparin has been initiated. At this point per [facility] physician no indication for initiation of thrombolytics. Patient's INR was noted to be 2.09. Patient reports taking his medications as prescribed. Of note troponin was noted to be elevated to 0.09. At PCP office was noted to have low PLT count confirmed in ED down to 75.  Vital signs remained stable.  Hospitalist was called for admission for large PE  Review of Systems:    Pertinent positives include:  shortness of breath at rest. dyspnea on exertion, indigestion, leg swelling Constitutional:  No weight loss, night sweats, Fevers, chills, fatigue, weight loss  HEENT:  No headaches,  Difficulty swallowing,Tooth/dental problems,Sore throat,  No sneezing, itching, ear ache, nasal congestion, post nasal drip,  Cardio-vascular:  No chest pain, Orthopnea, PND, anasarca, dizziness, palpitations.no Bilateral lower extremity swelling  GI:  No heartburn,  abdominal pain, nausea, vomiting, diarrhea, change in bowel habits, loss of appetite, melena, blood in stool, hematemesis Resp:    No excess mucus, no productive cough, No non-productive cough, No coughing up of blood.No change in color of mucus.No wheezing. Skin:  no rash or lesions. No jaundice GU:  no dysuria, change in color of urine, no urgency or frequency. No straining to urinate.  No flank pain.  Musculoskeletal:  No joint pain or no joint swelling. No decreased range of motion. No back pain.  Psych:  No change in mood or affect. No depression or anxiety. No memory loss.  Neuro: no localizing neurological complaints, no tingling, no weakness, no double vision, no gait abnormality, no slurred speech, no confusion  Otherwise ROS are negative except for above, 10 systems were reviewed  Past Medical History: Past Medical History  Diagnosis Date  . Lupus anticoagulant with hypercoagulable state 05/16/2011  . Hypertension   . Irregular heart beat   . A-fib   . Lymphedema    History reviewed. No pertinent past surgical history.   Medications: Prior to Admission medications   Medication Sig Start Date End Date Taking? Authorizing Provider  fluticasone (FLONASE) 50 MCG/ACT nasal spray Place 1 spray into both nostrils daily.   Yes Historical Provider, MD  HYDROcodone-acetaminophen (NORCO) 10-325 MG per tablet Take 10-325 mg by mouth Every 4 hours. 04/18/11  Yes Historical Provider, MD  lisinopril-hydrochlorothiazide (PRINZIDE,ZESTORETIC) 20-12.5 MG per tablet Take 20 mg by mouth Daily. 03/31/11  Yes Historical Provider, MD  metoprolol (LOPRESSOR) 50 MG tablet Take 50 mg by mouth Twice daily. 04/11/11  Yes Historical  Provider, MD  Multiple Vitamins-Minerals (MULTI-VITAMIN GUMMIES PO) Take 2 tablets by mouth daily.   Yes Historical Provider, MD  OXYCONTIN 40 MG 12 hr tablet Take 40 mg by mouth Twice daily. 05/13/11  Yes Historical Provider, MD  triamcinolone (NASACORT) 55 MCG/ACT AERO nasal inhaler Place 1 spray into the nose daily.   Yes Historical Provider, MD  warfarin (COUMADIN) 10 MG tablet Take 1 tablet (10 mg total) by mouth one time only at 6 PM. Patient taking differently: Take 10 mg by mouth daily.  09/19/13  Yes Volanda Napoleon, MD    Allergies:  No Known Allergies  Social History:  Ambulatory  independently   Lives at home   With family     reports that he quit smoking about 35 years ago. His smoking use included Cigarettes. He started smoking about 41 years ago. He has a 7.5 pack-year smoking history. He has never used smokeless tobacco. He reports that he does not drink alcohol or use illicit drugs.    Family History: family history includes Colon cancer in his father; Skin cancer in his mother.    Physical Exam: Patient Vitals for the past 24 hrs:  BP Temp Temp src Pulse Resp SpO2 Weight  07/11/14 1723 - - - - - - 121.564 kg (268 lb)  07/11/14 1712 112/78 mmHg 98.5 F (36.9 C) Oral 92 14 93 % -  07/11/14 1512 136/91 mmHg 98.1 F (36.7 C) Oral 107 18 98 % -    1. General:  in No Acute distress 2. Psychological: Alert and Oriented 3. Head/ENT:   Moist   Mucous Membranes                          Head Non traumatic, neck supple                          Normal   Dentition 4. SKIN: normal Skin turgor,  Skin clean Dry and intact no rash 5. Heart: Regular rate and rhythm no Murmur, Rub or gallop 6. Lungs: Clear to auscultation bilaterally, no wheezes or crackles   7. Abdomen: Soft, non-tender, Non distended 8. Lower extremities: no clubbing, cyanosis, severe edema of right lower extremity 9. Neurologically Grossly intact, moving all 4 extremities equally 10. MSK: Normal range of  motion  body mass index is 32.64 kg/(m^2).   Labs on Admission:   Results for orders placed or performed during the hospital encounter of 07/11/14 (from the past 24 hour(s))  CBC with Differential     Status: Abnormal   Collection Time: 07/11/14  4:05 PM  Result Value Ref Range   WBC 9.5 4.0 - 10.5 K/uL   RBC 5.53 4.22 - 5.81 MIL/uL   Hemoglobin 14.3 13.0 - 17.0 g/dL   HCT 43.2 39.0 - 52.0 %   MCV 78.1 78.0 - 100.0 fL   MCH 25.9 (L) 26.0 - 34.0 pg   MCHC 33.1 30.0 - 36.0 g/dL   RDW 14.0 11.5 - 15.5 %   Platelets 75 (L) 150 - 400 K/uL   Neutrophils Relative % 72 43 - 77 %   Lymphocytes Relative 20 12 - 46 %   Monocytes Relative 7  3 - 12 %   Eosinophils Relative 1 0 - 5 %   Basophils Relative 0 0 - 1 %   Neutro Abs 6.8 1.7 - 7.7 K/uL   Lymphs Abs 1.9 0.7 - 4.0 K/uL   Monocytes Absolute 0.7 0.1 - 1.0 K/uL   Eosinophils Absolute 0.1 0.0 - 0.7 K/uL   Basophils Absolute 0.0 0.0 - 0.1 K/uL  Comprehensive metabolic panel     Status: Abnormal   Collection Time: 07/11/14  4:05 PM  Result Value Ref Range   Sodium 135 135 - 145 mmol/L   Potassium 5.0 3.5 - 5.1 mmol/L   Chloride 103 96 - 112 mmol/L   CO2 23 19 - 32 mmol/L   Glucose, Bld 116 (H) 70 - 99 mg/dL   BUN 16 6 - 23 mg/dL   Creatinine, Ser 0.96 0.50 - 1.35 mg/dL   Calcium 9.6 8.4 - 10.5 mg/dL   Total Protein 7.8 6.0 - 8.3 g/dL   Albumin 4.6 3.5 - 5.2 g/dL   AST 32 0 - 37 U/L   ALT 26 0 - 53 U/L   Alkaline Phosphatase 69 39 - 117 U/L   Total Bilirubin 0.8 0.3 - 1.2 mg/dL   GFR calc non Af Amer >90 >90 mL/min   GFR calc Af Amer >90 >90 mL/min   Anion gap 9 5 - 15  Protime-INR     Status: Abnormal   Collection Time: 07/11/14  4:05 PM  Result Value Ref Range   Prothrombin Time 23.7 (H) 11.6 - 15.2 seconds   INR 2.09 (H) 0.00 - 1.49  APTT     Status: Abnormal   Collection Time: 07/11/14  4:05 PM  Result Value Ref Range   aPTT 80 (H) 24 - 37 seconds  Brain natriuretic peptide     Status: Abnormal   Collection Time:  07/11/14  4:05 PM  Result Value Ref Range   B Natriuretic Peptide 194.5 (H) 0.0 - 100.0 pg/mL  I-stat troponin, ED     Status: Abnormal   Collection Time: 07/11/14  4:08 PM  Result Value Ref Range   Troponin i, poc 0.09 (HH) 0.00 - 0.08 ng/mL   Comment NOTIFIED PHYSICIAN    Comment 3            UA not obtained  No results found for: HGBA1C  CrCl cannot be calculated (Unknown ideal weight.).  BNP (last 3 results) No results for input(s): PROBNP in the last 8760 hours.  Other results:  I have pearsonaly reviewed this: ECG REPORT  Rate: 97  Rhythm: Sinus rhythm ST&T Change: T wave inversion in leads V2 V3 and V4   Filed Weights   07/11/14 1723  Weight: 121.564 kg (268 lb)     Cultures: No results found for: SDES, SPECREQUEST, CULT, REPTSTATUS   Radiological Exams on Admission: Dg Chest 2 View  07/10/2014   CLINICAL DATA:  Shortness of breath with exertion for 3 days, history hypertension, atrial fibrillation, former smoker, hypercoagulable with lupus anticoagulant  EXAM: CHEST  2 VIEW  COMPARISON:  01/11/2005  FINDINGS: Upper normal heart size.  Normal mediastinal contours and pulmonary vascularity.  Minimal chronic peribronchial thickening.  No acute infiltrate, pleural effusion or pneumothorax.  Bones unremarkable.  IMPRESSION: Minimal chronic bronchitic changes without infiltrate.   Electronically Signed   By: Lavonia Dana M.D.   On: 07/10/2014 18:32   Ct Angio Chest Pe W/cm &/or Wo Cm  07/11/2014   CLINICAL DATA:  Two day history  of shortness of breath  EXAM: CT ANGIOGRAPHY CHEST WITH CONTRAST  TECHNIQUE: Multidetector CT imaging of the chest was performed using the standard protocol during bolus administration of intravenous contrast. Multiplanar CT image reconstructions and MIPs were obtained to evaluate the vascular anatomy.  CONTRAST:  184mL OMNIPAQUE IOHEXOL 350 MG/ML SOLN  COMPARISON:  Chest radiograph Aug 09, 2014  FINDINGS: There is extensive pulmonary embolus  bilaterally arising from both main pulmonary arteries and extending into multiple upper and lower lobe pulmonary artery branches bilaterally. There is right heart strain with right ventricle to left ventricle ratio of 1.42, normal less than 0.9.  There is no appreciable thoracic aortic aneurysm or dissection.  There is patchy atelectasis in both lower lobes. There is a wedge-shaped defect in the posterior right base, suspicious for focal infarct. There is mild patchy infiltrate in the superior segment of the right lower lobe, possibly a second infarct.  There is no appreciable thoracic adenopathy. The pericardium is not thickened. Visualized thyroid appears normal.  In the visualized upper abdomen, no lesions are identified.  There are no blastic or lytic bone lesions.  Review of the MIP images confirms the above findings.  IMPRESSION: Extensive central pulmonary emboli. Positive for acute PE with CT evidence of right heart strain (RV/LV Ratio = 1.42) consistent with at least submassive (intermediate risk) PE. The presence of right heart strain has been associated with an increased risk of morbidity and mortality. Please activate Code PE by paging [phone].  Probable infarct posterior right base. Question second infarct superior segment right lower lobe. Areas of patchy atelectatic change bilaterally.  No demonstrable adenopathy.  Critical Value/emergent results were called by telephone at the time of interpretation on 07/11/2014 at 5:00 pm to [facility] [place] , who verbally acknowledged these results.   Electronically Signed   By: Lowella Grip III M.D.   On: 07/11/2014 17:00    Chart has been reviewed  Assessment/Plan  54 yo M with hx of Lupus anticoagulant with prior DVT here with large PE evidence of right heart strain and elevated troponin  Present on Admission:  . Pulmonary embolism - admit to [facility] stepdown, hold coumadin, continue heparin gtt. Monitor for hemodynamic instability.  Cycle Ce,  gentle IVF, hold metoprolol and lisinopril. Hematology consult has been called at this point stating no idication for IVC filter. Likely will need to be discharged on Lovenox. Will obtain Doppler to evaluate for clot burden . Atrial fibrillation - currently in SR, holding metoprolol given acute PE . Essential hypertension - hold BP meds and monitor . Lupus anticoagulant with hypercoagulable state - as per hematology . Thrombocytopenia - likely due to large clot burden will continue to monitor expect to recover . Elevated troponin - most likely due to PE no chest pain, no ischemic ECG changes   Prophylaxis: Heparin,  CODE STATUS:  FULL CODE   Other plan as per orders.  I have spent a total of 75 min on this admission extra time was taken to discuss care with PCCM and Hematology Dr. Burr Medico aware.   [place] 07/11/2014, 6:57 PM  [facility]  Pager [phone]   after 2 AM please page floor coverage [place] If 7AM-7PM, please contact the day team taking care of the patient  [email or web address]  Password [email or web address]

## 2014-07-12 ENCOUNTER — Other Ambulatory Visit: Payer: Self-pay

## 2014-07-12 DIAGNOSIS — I2699 Other pulmonary embolism without acute cor pulmonale: Secondary | ICD-10-CM

## 2014-07-12 DIAGNOSIS — I482 Chronic atrial fibrillation: Secondary | ICD-10-CM

## 2014-07-12 DIAGNOSIS — D6862 Lupus anticoagulant syndrome: Secondary | ICD-10-CM

## 2014-07-12 LAB — HEPARIN LEVEL (UNFRACTIONATED)
HEPARIN UNFRACTIONATED: 0.37 [IU]/mL (ref 0.30–0.70)
Heparin Unfractionated: 0.46 IU/mL (ref 0.30–0.70)

## 2014-07-12 LAB — MRSA PCR SCREENING: MRSA by PCR: NEGATIVE

## 2014-07-12 LAB — ABO/RH: ABO/RH(D): A POS

## 2014-07-12 MED ORDER — FLUTICASONE PROPIONATE 50 MCG/ACT NA SUSP
1.0000 | Freq: Every day | NASAL | Status: DC
  Administered 2014-07-12 – 2014-07-18 (×7): 1 via NASAL
  Filled 2014-07-12 (×2): qty 16

## 2014-07-12 MED ORDER — SODIUM CHLORIDE 0.9 % IV SOLN
INTRAVENOUS | Status: AC
  Administered 2014-07-12 (×2): via INTRAVENOUS

## 2014-07-12 MED ORDER — ONDANSETRON HCL 4 MG PO TABS: 4.0000 mg | ORAL_TABLET | Freq: Four times a day (QID) | ORAL | Status: DC | PRN

## 2014-07-12 MED ORDER — SODIUM CHLORIDE 0.9 % IJ SOLN
3.0000 mL | Freq: Two times a day (BID) | INTRAMUSCULAR | Status: DC
  Administered 2014-07-14 – 2014-07-18 (×5): 3 mL via INTRAVENOUS

## 2014-07-12 MED ORDER — LORAZEPAM 1 MG PO TABS
1.0000 mg | ORAL_TABLET | Freq: Once | ORAL | Status: AC
  Administered 2014-07-12: 1 mg via ORAL
  Filled 2014-07-12: qty 1

## 2014-07-12 MED ORDER — ONDANSETRON HCL 4 MG/2ML IJ SOLN
4.0000 mg | Freq: Four times a day (QID) | INTRAMUSCULAR | Status: DC | PRN
Start: 2014-07-12 — End: 2014-07-18

## 2014-07-12 MED ORDER — ALPRAZOLAM 0.25 MG PO TABS
0.2500 mg | ORAL_TABLET | Freq: Every evening | ORAL | Status: DC | PRN
  Administered 2014-07-12: 0.25 mg via ORAL
  Filled 2014-07-12: qty 1

## 2014-07-12 MED ORDER — ACETAMINOPHEN 325 MG PO TABS: 650.0000 mg | ORAL_TABLET | Freq: Four times a day (QID) | ORAL | Status: DC | PRN

## 2014-07-12 MED ORDER — ALBUTEROL SULFATE (2.5 MG/3ML) 0.083% IN NEBU: 2.5000 mg | INHALATION_SOLUTION | RESPIRATORY_TRACT | Status: DC | PRN

## 2014-07-12 MED ORDER — HYDROCODONE-ACETAMINOPHEN 10-325 MG PO TABS
1.0000 | ORAL_TABLET | Freq: Four times a day (QID) | ORAL | Status: DC | PRN
  Administered 2014-07-12 – 2014-07-15 (×9): 1 via ORAL
  Filled 2014-07-12 (×10): qty 1

## 2014-07-12 MED ORDER — OXYCODONE HCL ER 40 MG PO T12A
40.0000 mg | EXTENDED_RELEASE_TABLET | Freq: Two times a day (BID) | ORAL | Status: DC
  Administered 2014-07-12 – 2014-07-18 (×14): 40 mg via ORAL
  Filled 2014-07-12 (×14): qty 1

## 2014-07-12 MED ORDER — ACETAMINOPHEN 650 MG RE SUPP: 650.0000 mg | Freq: Four times a day (QID) | RECTAL | Status: DC | PRN

## 2014-07-12 NOTE — Consult Note (Signed)
Chief Complaint: Chief Complaint  Patient presents with  . Shortness of Breath    Referring Physician(s): * No referring provider recorded for this case *  History of Present Illness: Lee Phillips is a 54 y.o. male with Lupus anticoagulant and on coumadin, found to have break through PE 07/11/14. CTA showed RV/LV of 1.4. There is moderate cot burden in the PAs. He has had SOB for weeks, but apparently this has gotten worse recently. He describes DOE, but is comfortable in bed and wishes to D/C O2 [place].  Past Medical History  Diagnosis Date  . Lupus anticoagulant with hypercoagulable state 05/16/2011  . Hypertension   . Irregular heart beat   . A-fib   . Lymphedema     History reviewed. No pertinent past surgical history.  Allergies: Review of patient's allergies indicates no known allergies.  Medications: Prior to Admission medications   Medication Sig Start Date End Date Taking? Authorizing Provider  fluticasone (FLONASE) 50 MCG/ACT nasal spray Place 1 spray into both nostrils daily.   Yes Historical Provider, MD  HYDROcodone-acetaminophen (NORCO) 10-325 MG per tablet Take 10-325 mg by mouth Every 4 hours. 04/18/11  Yes Historical Provider, MD  lisinopril-hydrochlorothiazide (PRINZIDE,ZESTORETIC) 20-12.5 MG per tablet Take 20 mg by mouth Daily. 03/31/11  Yes Historical Provider, MD  metoprolol (LOPRESSOR) 50 MG tablet Take 50 mg by mouth Twice daily. 04/11/11  Yes Historical Provider, MD  Multiple Vitamins-Minerals (MULTI-VITAMIN GUMMIES PO) Take 2 tablets by mouth daily.   Yes Historical Provider, MD  OXYCONTIN 40 MG 12 hr tablet Take 40 mg by mouth Twice daily. 05/13/11  Yes Historical Provider, MD  triamcinolone (NASACORT) 55 MCG/ACT AERO nasal inhaler Place 1 spray into the nose daily.   Yes Historical Provider, MD  warfarin (COUMADIN) 10 MG tablet Take 1 tablet (10 mg total) by mouth one time only at 6 PM. Patient taking differently: Take 10 mg by mouth daily.  09/19/13   Yes Volanda Napoleon, MD     Family History  Problem Relation Age of Onset  . Colon cancer Father   . Skin cancer Mother   . Clotting disorder Neg Hx   . Lupus Neg Hx   . CAD Neg Hx   . Diabetes Mellitus II Neg Hx     History   Social History  . Marital Status: Married    Spouse Name: N/A  . Number of Children: N/A  . Years of Education: N/A   Social History Main Topics  . Smoking status: Former Smoker -- 1.50 packs/day for 5 years    Types: Cigarettes    Start date: 05/22/1973    Quit date: 07/21/1978  . Smokeless tobacco: Never Used     Comment: quit 35 years ago  . Alcohol Use: No  . Drug Use: No  . Sexual Activity: Not on file   Other Topics Concern  . None   Social History Narrative      Review of Systems: A 12 point ROS discussed and pertinent positives are indicated in the HPI above.  All other systems are negative.  Review of Systems  Vital Signs: BP 153/95 mmHg  Pulse 87  Temp(Src) 98 F (36.7 C) (Oral)  Resp 19  Ht 6\' 4"  (1.93 m)  Wt 258 lb 13.1 oz (117.4 kg)  BMI 31.52 kg/m2  SpO2 89%  Physical Exam  Constitutional:  He is resting comfortably in bed, Breathing is non labored. HR is 90s. SBP 150s.  Mallampati Score:     Imaging: Dg Chest 2 View  07/10/2014   CLINICAL DATA:  Shortness of breath with exertion for 3 days, history hypertension, atrial fibrillation, former smoker, hypercoagulable with lupus anticoagulant  EXAM: CHEST  2 VIEW  COMPARISON:  01/11/2005  FINDINGS: Upper normal heart size.  Normal mediastinal contours and pulmonary vascularity.  Minimal chronic peribronchial thickening.  No acute infiltrate, pleural effusion or pneumothorax.  Bones unremarkable.  IMPRESSION: Minimal chronic bronchitic changes without infiltrate.   Electronically Signed   By: Lavonia Dana M.D.   On: 07/10/2014 18:32   Ct Angio Chest Pe W/cm &/or Wo Cm  07/11/2014   CLINICAL DATA:  Two day history of shortness of breath  EXAM: CT ANGIOGRAPHY CHEST WITH  CONTRAST  TECHNIQUE: Multidetector CT imaging of the chest was performed using the standard protocol during bolus administration of intravenous contrast. Multiplanar CT image reconstructions and MIPs were obtained to evaluate the vascular anatomy.  CONTRAST:  14mL OMNIPAQUE IOHEXOL 350 MG/ML SOLN  COMPARISON:  Chest radiograph Aug 09, 2014  FINDINGS: There is extensive pulmonary embolus bilaterally arising from both main pulmonary arteries and extending into multiple upper and lower lobe pulmonary artery branches bilaterally. There is right heart strain with right ventricle to left ventricle ratio of 1.42, normal less than 0.9.  There is no appreciable thoracic aortic aneurysm or dissection.  There is patchy atelectasis in both lower lobes. There is a wedge-shaped defect in the posterior right base, suspicious for focal infarct. There is mild patchy infiltrate in the superior segment of the right lower lobe, possibly a second infarct.  There is no appreciable thoracic adenopathy. The pericardium is not thickened. Visualized thyroid appears normal.  In the visualized upper abdomen, no lesions are identified.  There are no blastic or lytic bone lesions.  Review of the MIP images confirms the above findings.  IMPRESSION: Extensive central pulmonary emboli. Positive for acute PE with CT evidence of right heart strain (RV/LV Ratio = 1.42) consistent with at least submassive (intermediate risk) PE. The presence of right heart strain has been associated with an increased risk of morbidity and mortality. Please activate Code PE by paging [phone].  Probable infarct posterior right base. Question second infarct superior segment right lower lobe. Areas of patchy atelectatic change bilaterally.  No demonstrable adenopathy.  Critical Value/emergent results were called by telephone at the time of interpretation on 07/11/2014 at 5:00 pm to [facility], [facility] , who verbally acknowledged these results.   Electronically Signed    By: Lowella Grip III M.D.   On: 07/11/2014 17:00    Labs:  CBC:  Recent Labs  07/11/14 1605 07/11/14 1920  WBC 9.5 10.6*  HGB 14.3 14.6  HCT 43.2 43.8  PLT 75* 74*    COAGS:  Recent Labs  09/19/13 0751 10/08/13 0756 07/11/14 1605  INR 1.5* 2.2 2.09*  APTT  --   --  80*    BMP:  Recent Labs  07/11/14 1605  NA 135  K 5.0  CL 103  CO2 23  GLUCOSE 116*  BUN 16  CALCIUM 9.6  CREATININE 0.96  GFRNONAA >90  GFRAA >90    LIVER FUNCTION TESTS:  Recent Labs  07/11/14 1605  BILITOT 0.8  AST 32  ALT 26  ALKPHOS 69  PROT 7.8  ALBUMIN 4.6    TUMOR MARKERS: No results for input(s): AFPTM, CEA, CA199, CHROMGRNA in the last 8760 hours.  Assessment and Plan:  Pt has Submassive PE and  evidence of RH strain on CT but relatively asymptomatic. He may benefit from EKOS PE lysis in reducing long term morbidity from PA HTN. Will allow INR to drift down over the weekend and re-evaluate Monday morning.  Thank you for this interesting consult.  I greatly enjoyed meeting Lee Phillips and look forward to participating in their care.  Signed: Idriss Quackenbush, ART A 07/12/2014, 10:28 AM   I spent a total of 40 Minutes  in face to face in clinical consultation, greater than 50% of which was counseling/coordinating care for PA lysis.

## 2014-07-12 NOTE — Progress Notes (Signed)
*  PRELIMINARY RESULTS* Vascular Ultrasound Lower extremity venous duplex has been completed.  Preliminary findings: DVT noted in the Right popliteal vein, posterior tibial veins, and peroneal veins. Although, not all calf veins well visualized due to depth of vessels. No DVT LLE.   Landry Mellow, RDMS, RVT   07/12/2014, 2:25 PM

## 2014-07-12 NOTE — Progress Notes (Signed)
Already seen by CCM fellow at 5 am Comfortable at rest on 1lpm and no tachycardia/no desat on 1lpm and wants off 02  Hep is therapeutic  Rec Ok with me if up in chair now and wean 02 for sat > 90% F/u echo w/a but clinically does not have R ht strain or failure and likely if PA elevated it is chronic (from prev low grade PE's and well compensated )  The main challenge with this pt is severe chronic venous issues RLE and how to manage his longterm anticoagulation the latter issue needs to be addressed by [facility]'s team (hematology) as they have been following him longterm and not clear coumadin's still the best choice  For now therapeutic levels of hep all that's needed  We will see again prn.    Lee Gully, MD Pulmonary and [place] [phone] After 5:30 PM or weekends, call [phone]

## 2014-07-12 NOTE — Consult Note (Signed)
PULMONARY  / CRITICAL CARE MEDICINE CONSULTATION   Name: Lee Phillips MRN: [id number] DOB: [birth year]    ADMISSION DATE:  07/11/2014 CONSULTATION DATE: 07/11/2014  REQUESTING CLINICIAN: Dr. Arnoldo Morale PRIMARY SERVICE: TRH  CHIEF COMPLAINT:  SOB  BRIEF PATIENT DESCRIPTION: 54 M with Lupus Anticoagulant with PE while on Coumadin. PCCM consulted for possible co-management in setting of possible EKOS.   SIGNIFICANT EVENTS / STUDIES:  CT PE Protocol 4/9 --> PE  LINES / TUBES: PIV  CULTURES: None  ANTIBIOTICS: None  HISTORY OF PRESENT ILLNESS:  Lee Phillips is a 54 yo M with lupus anticoagulant who presented to [facility] on 4/8 with SOB. He was found to have PEs and was transferred to [facility] for possible EKOS. PCCM was consulted for possible co-management in the setting of EKOS. His shortness of breath has been present for several weeks. There is no associated chest pain, fever, or chills. He has been compliant with his warfarin.   PAST MEDICAL HISTORY :  Past Medical History  Diagnosis Date  . Lupus anticoagulant with hypercoagulable state 05/16/2011  . Hypertension   . Irregular heart beat   . A-fib   . Lymphedema    History reviewed. No pertinent past surgical history. Prior to Admission medications   Medication Sig Start Date End Date Taking? Authorizing Provider  fluticasone (FLONASE) 50 MCG/ACT nasal spray Place 1 spray into both nostrils daily.   Yes Historical Provider, MD  HYDROcodone-acetaminophen (NORCO) 10-325 MG per tablet Take 10-325 mg by mouth Every 4 hours. 04/18/11  Yes Historical Provider, MD  lisinopril-hydrochlorothiazide (PRINZIDE,ZESTORETIC) 20-12.5 MG per tablet Take 20 mg by mouth Daily. 03/31/11  Yes Historical Provider, MD  metoprolol (LOPRESSOR) 50 MG tablet Take 50 mg by mouth Twice daily. 04/11/11  Yes Historical Provider, MD  Multiple Vitamins-Minerals (MULTI-VITAMIN GUMMIES PO) Take 2 tablets by mouth daily.   Yes Historical Provider, MD  OXYCONTIN 40 MG 12 hr  tablet Take 40 mg by mouth Twice daily. 05/13/11  Yes Historical Provider, MD  triamcinolone (NASACORT) 55 MCG/ACT AERO nasal inhaler Place 1 spray into the nose daily.   Yes Historical Provider, MD  warfarin (COUMADIN) 10 MG tablet Take 1 tablet (10 mg total) by mouth one time only at 6 PM. Patient taking differently: Take 10 mg by mouth daily.  09/19/13  Yes Volanda Napoleon, MD   No Known Allergies  FAMILY HISTORY:  Family History  Problem Relation Age of Onset  . Colon cancer Father   . Skin cancer Mother   . Clotting disorder Neg Hx   . Lupus Neg Hx   . CAD Neg Hx   . Diabetes Mellitus II Neg Hx    SOCIAL HISTORY:  reports that he quit smoking about 36 years ago. His smoking use included Cigarettes. He started smoking about 41 years ago. He has a 7.5 pack-year smoking history. He has never used smokeless tobacco. He reports that he does not drink alcohol or use illicit drugs.  REVIEW OF SYSTEMS:  A 12-system ROS was conducted and, unless otherwise specified in the HPI, was negative.   SUBJECTIVE:   VITAL SIGNS: Temp:  [98 F (36.7 C)-98.6 F (37 C)] 98.2 F (36.8 C) (04/09 0313) Pulse Rate:  [83-107] 83 (04/09 0313) Resp:  [14-25] 25 (04/09 0313) BP: (112-146)/(77-96) 134/77 mmHg (04/09 0313) SpO2:  [90 %-98 %] 92 % (04/09 0313) Weight:  [258 lb 13.1 oz (117.4 kg)-268 lb (121.564 kg)] 258 lb 13.1 oz (117.4 kg) (04/09 0100) HEMODYNAMICS:  VENTILATOR SETTINGS:   INTAKE / OUTPUT: Intake/Output      04/08 0701 - 04/09 0700   Urine (mL/kg/hr) 200   Stool 0   Total Output 200   Net -200       Stool Occurrence 0 x     PHYSICAL EXAMINATION: General:  WDWN M in NAD Neuro:  Intact HEENT:  Sclera anicteric, conjunctiva pink, MMM, OP Clear Neck: Trachea supple and mildine, (-) LAN or JVD Cardiovascular:  RRR, nS1/S2, (-) MRG Lungs:  CTAB Abdomen:  S/NT/ND/(+)BS Musculoskeletal:  (-) C/C/E Skin:  Intact  LABS:  CBC  Recent Labs Lab 07/11/14 1605 07/11/14 1920   WBC 9.5 10.6*  HGB 14.3 14.6  HCT 43.2 43.8  PLT 75* 74*   Coag's  Recent Labs Lab 07/11/14 1605  APTT 80*  INR 2.09*   BMET  Recent Labs Lab 07/11/14 1605  NA 135  K 5.0  CL 103  CO2 23  BUN 16  CREATININE 0.96  GLUCOSE 116*   Electrolytes  Recent Labs Lab 07/11/14 1605  CALCIUM 9.6   Sepsis Markers No results for input(s): LATICACIDVEN, PROCALCITON, O2SATVEN in the last 168 hours. ABG No results for input(s): PHART, PCO2ART, PO2ART in the last 168 hours. Liver Enzymes  Recent Labs Lab 07/11/14 1605  AST 32  ALT 26  ALKPHOS 69  BILITOT 0.8  ALBUMIN 4.6   Cardiac Enzymes  Recent Labs Lab 07/11/14 1920  TROPONINI 0.16*   Glucose No results for input(s): GLUCAP in the last 168 hours.  Imaging Dg Chest 2 View  07/10/2014   CLINICAL DATA:  Shortness of breath with exertion for 3 days, history hypertension, atrial fibrillation, former smoker, hypercoagulable with lupus anticoagulant  EXAM: CHEST  2 VIEW  COMPARISON:  01/11/2005  FINDINGS: Upper normal heart size.  Normal mediastinal contours and pulmonary vascularity.  Minimal chronic peribronchial thickening.  No acute infiltrate, pleural effusion or pneumothorax.  Bones unremarkable.  IMPRESSION: Minimal chronic bronchitic changes without infiltrate.   Electronically Signed   By: Lavonia Dana M.D.   On: 07/10/2014 18:32   Ct Angio Chest Pe W/cm &/or Wo Cm  07/11/2014   CLINICAL DATA:  Two day history of shortness of breath  EXAM: CT ANGIOGRAPHY CHEST WITH CONTRAST  TECHNIQUE: Multidetector CT imaging of the chest was performed using the standard protocol during bolus administration of intravenous contrast. Multiplanar CT image reconstructions and MIPs were obtained to evaluate the vascular anatomy.  CONTRAST:  146mL OMNIPAQUE IOHEXOL 350 MG/ML SOLN  COMPARISON:  Chest radiograph Aug 09, 2014  FINDINGS: There is extensive pulmonary embolus bilaterally arising from both main pulmonary arteries and extending  into multiple upper and lower lobe pulmonary artery branches bilaterally. There is right heart strain with right ventricle to left ventricle ratio of 1.42, normal less than 0.9.  There is no appreciable thoracic aortic aneurysm or dissection.  There is patchy atelectasis in both lower lobes. There is a wedge-shaped defect in the posterior right base, suspicious for focal infarct. There is mild patchy infiltrate in the superior segment of the right lower lobe, possibly a second infarct.  There is no appreciable thoracic adenopathy. The pericardium is not thickened. Visualized thyroid appears normal.  In the visualized upper abdomen, no lesions are identified.  There are no blastic or lytic bone lesions.  Review of the MIP images confirms the above findings.  IMPRESSION: Extensive central pulmonary emboli. Positive for acute PE with CT evidence of right heart strain (RV/LV Ratio = 1.42) consistent  with at least submassive (intermediate risk) PE. The presence of right heart strain has been associated with an increased risk of morbidity and mortality. Please activate Code PE by paging [phone].  Probable infarct posterior right base. Question second infarct superior segment right lower lobe. Areas of patchy atelectatic change bilaterally.  No demonstrable adenopathy.  Critical Value/emergent results were called by telephone at the time of interpretation on 07/11/2014 at 5:00 pm to [facility], [facility] , who verbally acknowledged these results.   Electronically Signed   By: Lowella Grip III M.D.   On: 07/11/2014 17:00    EKG: Reviewed  ASSESSMENT / PLAN:  PULMONARY A: Acute PE: Mildly elevated trops, BNP, and evidence of RV strain all suggestive of submassive PE. No emergent indication for intervention. P:   Heparin Drip and Long-Term AC plan per hematology Will be evaluated by VIR in AM for EKOS  CARDIOVASCULAR A: Afib:  HTN P:   Monitor Cont OP regimen when primary team  comfortable  RENAL A: No acute issues  GASTROINTESTINAL A: No acute issues  HEMATOLOGIC A: Acute thrombocytopenia: Per Hem likely 2/2 consumption in setting of PE P:   Monitor  INFECTIOUS A: No acute issues  ENDOCRINE A: Elevated Glucose:  P:   Check A1c Monitor  NEUROLOGIC A: No acute issues  TODAY'S SUMMARY:   I have personally obtained a history, examined the patient, evaluated laboratory and imaging results, formulated the assessment and plan and placed orders.  Margarette Asal, MD Pulmonary and [place] Pager: [phone]   07/12/2014, 4:42 AM

## 2014-07-12 NOTE — Progress Notes (Addendum)
ANTICOAGULATION CONSULT NOTE - Follow Up Consult  Pharmacy Consult for heparin Indication: pulmonary embolus  Labs:  Recent Labs  07/11/14 1605 07/11/14 1920 07/12/14 0346  HGB 14.3 14.6  --   HCT 43.2 43.8  --   PLT 75* 74*  --   APTT 80*  --   --   LABPROT 23.7*  --   --   INR 2.09*  --   --   HEPARINUNFRC  --   --  0.46  CREATININE 0.96  --   --   TROPONINI  --  0.16*  --      Assessment/Plan:  54yo male therapeutic on heparin with initial dosing for acute PE. Will continue gtt at current rate and confirm stable with additional level.   Wynona Neat, PharmD, BCPS  07/12/2014,4:34 AM  _______________________________________________________ Confirmatory heparin level remains therapeutic at 0.37. Hgb 14.6, plts 74. No bleeding noted.  Plan: Continue heparin at 2000units/hr Daily HL/CBC Monitor s/sx of bleeding  Thank you for allowing pharmacy to be part of this patient's care team  [place], Pharm.D Clinical Pharmacy Resident Pager: [phone] 07/12/2014 .11:44 AM

## 2014-07-12 NOTE — Progress Notes (Signed)
[facility] PROGRESS NOTE  MASYN ROSTRO ZTI:[id number] DOB: 08-20-1960 DOA: 07/11/2014 PCP: Reginia Naas, MD  Assessment/Plan:   DEEP VENOUS THROMBOPHLEBITIS, HX OF/ Lupus anticoagulant with hypercoagulable state/Pulmonary embolism - No plans for hypercoagulable workup patient was -Nephrology on board. - IR consulted for consideration of EKOS PE lysis reduction of long term morbidity from pulmonary artery hypertension - doppler of lower extremities ordered  Active Problems:   Essential hypertension - Currently holding hypertensive in lieu of submassive PE    Atrial fibrillation - currently on heparin gtt - rate controlled off of rate control medication    Thrombocytopenia - hematology on board and assisting.    Elevated troponin - Most likely 2ary to PE, no chest pain reported today  Code Status:full Family Communication: discussed with family at bedside.  Disposition Plan: Pending improvement in condition   Consultants:  Radiology  Critical care  Hematology  Procedures:  pending   HPI/Subjective: The patient has no new complaints. Currently sitting in chair denies any shortness of breath. He was inquiring as to whether he would be able to afford Lovenox as outpatient. He is under the impression he will not be able to  Objective: Filed Vitals:   07/12/14 1250  BP: 157/90  Pulse: 85  Temp: 97.9 F (36.6 C)  Resp: 18    Intake/Output Summary (Last 24 hours) at 07/12/14 1350 Last data filed at 07/12/14 1102  Gross per 24 hour  Intake    735 ml  Output    675 ml  Net     60 ml   Filed Weights   07/11/14 1723 07/12/14 0100  Weight: 121.564 kg (268 lb) 117.4 kg (258 lb 13.1 oz)    Exam:   General:  Pt in nad, alert and awake  Cardiovascular: s1 and s2 present, no rubs  Respiratory: cta bl, no wheezes  Abdomen: soft, Nt, nd  Musculoskeletal: no cyanosis or clubbing   Data Reviewed: Basic Metabolic Panel:  Recent Labs Lab  07/11/14 1605  NA 135  K 5.0  CL 103  CO2 23  GLUCOSE 116*  BUN 16  CREATININE 0.96  CALCIUM 9.6   Liver Function Tests:  Recent Labs Lab 07/11/14 1605  AST 32  ALT 26  ALKPHOS 69  BILITOT 0.8  PROT 7.8  ALBUMIN 4.6   No results for input(s): LIPASE, AMYLASE in the last 168 hours. No results for input(s): AMMONIA in the last 168 hours. CBC:  Recent Labs Lab 07/11/14 1605 07/11/14 1920  WBC 9.5 10.6*  NEUTROABS 6.8  --   HGB 14.3 14.6  HCT 43.2 43.8  MCV 78.1 78.5  PLT 75* 74*   Cardiac Enzymes:  Recent Labs Lab 07/11/14 1920  TROPONINI 0.16*   BNP (last 3 results)  Recent Labs  07/11/14 1605  BNP 194.5*    ProBNP (last 3 results) No results for input(s): PROBNP in the last 8760 hours.  CBG: No results for input(s): GLUCAP in the last 168 hours.  Recent Results (from the past 240 hour(s))  MRSA PCR Screening     Status: None   Collection Time: 07/12/14  1:39 AM  Result Value Ref Range Status   MRSA by PCR NEGATIVE NEGATIVE Final    Comment:        The GeneXpert MRSA Assay (FDA approved for NASAL specimens only), is one component of a comprehensive MRSA colonization surveillance program. It is not intended to diagnose MRSA infection nor to guide or monitor treatment for MRSA infections.  Studies: Dg Chest 2 View  07/10/2014   CLINICAL DATA:  Shortness of breath with exertion for 3 days, history hypertension, atrial fibrillation, former smoker, hypercoagulable with lupus anticoagulant  EXAM: CHEST  2 VIEW  COMPARISON:  01/11/2005  FINDINGS: Upper normal heart size.  Normal mediastinal contours and pulmonary vascularity.  Minimal chronic peribronchial thickening.  No acute infiltrate, pleural effusion or pneumothorax.  Bones unremarkable.  IMPRESSION: Minimal chronic bronchitic changes without infiltrate.   Electronically Signed   By: Lavonia Dana M.D.   On: 07/10/2014 18:32   Ct Angio Chest Pe W/cm &/or Wo Cm  07/11/2014   CLINICAL DATA:   Two day history of shortness of breath  EXAM: CT ANGIOGRAPHY CHEST WITH CONTRAST  TECHNIQUE: Multidetector CT imaging of the chest was performed using the standard protocol during bolus administration of intravenous contrast. Multiplanar CT image reconstructions and MIPs were obtained to evaluate the vascular anatomy.  CONTRAST:  19mL OMNIPAQUE IOHEXOL 350 MG/ML SOLN  COMPARISON:  Chest radiograph Aug 09, 2014  FINDINGS: There is extensive pulmonary embolus bilaterally arising from both main pulmonary arteries and extending into multiple upper and lower lobe pulmonary artery branches bilaterally. There is right heart strain with right ventricle to left ventricle ratio of 1.42, normal less than 0.9.  There is no appreciable thoracic aortic aneurysm or dissection.  There is patchy atelectasis in both lower lobes. There is a wedge-shaped defect in the posterior right base, suspicious for focal infarct. There is mild patchy infiltrate in the superior segment of the right lower lobe, possibly a second infarct.  There is no appreciable thoracic adenopathy. The pericardium is not thickened. Visualized thyroid appears normal.  In the visualized upper abdomen, no lesions are identified.  There are no blastic or lytic bone lesions.  Review of the MIP images confirms the above findings.  IMPRESSION: Extensive central pulmonary emboli. Positive for acute PE with CT evidence of right heart strain (RV/LV Ratio = 1.42) consistent with at least submassive (intermediate risk) PE. The presence of right heart strain has been associated with an increased risk of morbidity and mortality. Please activate Code PE by paging [phone].  Probable infarct posterior right base. Question second infarct superior segment right lower lobe. Areas of patchy atelectatic change bilaterally.  No demonstrable adenopathy.  Critical Value/emergent results were called by telephone at the time of interpretation on 07/11/2014 at 5:00 pm to [facility], [facility] , who verbally acknowledged these results.   Electronically Signed   By: Lowella Grip III M.D.   On: 07/11/2014 17:00    Scheduled Meds: . fluticasone  1 spray Each Nare Daily  . OxyCODONE  40 mg Oral Q12H  . sodium chloride  3 mL Intravenous Q12H   Continuous Infusions: . heparin 2,000 Units/hr (07/12/14 0800)     Time spent: > 35 minutes    [name]  [facility] Pager [phone]. If 7PM-7AM, please contact night-coverage at [email or web address], password [facility] 07/12/2014, 1:50 PM  LOS: 1 day

## 2014-07-13 LAB — CBC
HCT: 40 % (ref 39.0–52.0)
Hemoglobin: 13.3 g/dL (ref 13.0–17.0)
MCH: 25.8 pg — AB (ref 26.0–34.0)
MCHC: 33.3 g/dL (ref 30.0–36.0)
MCV: 77.7 fL — ABNORMAL LOW (ref 78.0–100.0)
Platelets: 56 10*3/uL — ABNORMAL LOW (ref 150–400)
RBC: 5.15 MIL/uL (ref 4.22–5.81)
RDW: 14.2 % (ref 11.5–15.5)
WBC: 9.4 10*3/uL (ref 4.0–10.5)

## 2014-07-13 LAB — PROTIME-INR
INR: 1.95 — ABNORMAL HIGH (ref 0.00–1.49)
PROTHROMBIN TIME: 22.4 s — AB (ref 11.6–15.2)

## 2014-07-13 LAB — HEPARIN LEVEL (UNFRACTIONATED): HEPARIN UNFRACTIONATED: 0.46 [IU]/mL (ref 0.30–0.70)

## 2014-07-13 LAB — APTT

## 2014-07-13 MED ORDER — LORAZEPAM 1 MG PO TABS
1.0000 mg | ORAL_TABLET | Freq: Every evening | ORAL | Status: DC | PRN
  Administered 2014-07-13 – 2014-07-17 (×5): 1 mg via ORAL
  Filled 2014-07-13 (×5): qty 1

## 2014-07-13 NOTE — Progress Notes (Signed)
ANTICOAGULATION CONSULT NOTE - Follow Up Consult  Pharmacy Consult for heparin Indication: pulmonary embolus and DVT, hypercoaguable state w/ hx of atrial fibrillation and lupus anticoagulant  No Known Allergies  Patient Measurements: Height: 6\' 4"  (193 cm) Weight: 258 lb 13.1 oz (117.4 kg) IBW/kg (Calculated) : 86.8 Heparin Dosing Weight: 111kg  Vital Signs: Temp: 98.1 F (36.7 C) (04/10 0700) Temp Source: Oral (04/10 0700) BP: 121/81 mmHg (04/10 0440) Pulse Rate: 87 (04/10 0440)  Labs:  Recent Labs  07/11/14 1605 07/11/14 1920 07/12/14 0346 07/12/14 1030 07/13/14 0425  HGB 14.3 14.6  --   --  13.3  HCT 43.2 43.8  --   --  40.0  PLT 75* 74*  --   --  56*  APTT 80*  --   --   --   --   LABPROT 23.7*  --   --   --   --   INR 2.09*  --   --   --   --   HEPARINUNFRC  --   --  0.46 0.37 0.46  CREATININE 0.96  --   --   --   --   TROPONINI  --  0.16*  --   --   --     Estimated Creatinine Clearance: 121.7 mL/min (by C-G formula based on Cr of 0.96).   Medications:  Scheduled:  . fluticasone  1 spray Each Nare Daily  . OxyCODONE  40 mg Oral Q12H  . sodium chloride  3 mL Intravenous Q12H   Infusions:  . heparin 2,000 Units/hr (07/13/14 0600)    Assessment: 54 y.o. male  with acute onset of shortness of breath and dyspnea on exertion 2 days prior to admission. Patient is on chronic warfarin PTA for lupus anticoagulant and atrial fibrillation. Patient found to have extensive PE and DVT, heparin gtt started. HL remains therapeutic this AM at 0.46. Hgb 13.3, plts 74>>56, no bleeding noted.  Goal of Therapy:  Heparin level 0.3-0.7 units/ml Monitor platelets by anticoagulation protocol: Yes   Plan:  Continue heparin at 2000units/hr Daily HL/CBC Monitor s/sx of bleeding  Thank you for allowing pharmacy to be part of this patient's care team  [place], Pharm.D Clinical Pharmacy Resident Pager: [phone] 07/13/2014 .9:02 AM

## 2014-07-13 NOTE — Progress Notes (Signed)
[facility] PROGRESS NOTE  Lee Phillips HYI:[id number] DOB: [birth year] DOA: 07/11/2014 PCP: Reginia Naas, MD  Assessment/Plan:   DEEP VENOUS THROMBOPHLEBITIS, HX OF/ Lupus anticoagulant with hypercoagulable state/Pulmonary embolism - No plans for hypercoagulable workup patient was - Hematology/Pulmonology on board. - IR consulted for consideration of EKOS PE lysis reduction of long term morbidity from pulmonary artery hypertension - doppler of lower extremities completed final report pending. Preliminary reports however report clot at right lower extremity  Active Problems:   Essential hypertension - Currently holding hypertensive medications in lieu of submassive PE    Atrial fibrillation - currently on heparin gtt - rate controlled off of rate control medication    Thrombocytopenia - hematology on board and assisting.    Elevated troponin - Most likely 2ary to PE, no chest pain reported today  Code Status:full Family Communication: discussed with family at bedside.  Disposition Plan: Pending improvement in condition   Consultants:  Radiology  Critical care  Hematology  Procedures:  pending   HPI/Subjective: The patient has no new complaints. Had some difficulty sleeping initially but currently has no new complaints.  Objective: Filed Vitals:   07/13/14 0756  BP: 126/82  Pulse: 91  Temp:   Resp: 22    Intake/Output Summary (Last 24 hours) at 07/13/14 1140 Last data filed at 07/13/14 0900  Gross per 24 hour  Intake   1260 ml  Output    975 ml  Net    285 ml   Filed Weights   07/11/14 1723 07/12/14 0100  Weight: 121.564 kg (268 lb) 117.4 kg (258 lb 13.1 oz)    Exam:   General:  Pt in nad, alert and awake  Cardiovascular: s1 and s2 present, no rubs  Respiratory: cta bl, no wheezes  Abdomen: soft, Nt, nd  Musculoskeletal: no cyanosis or clubbing   Data Reviewed: Basic Metabolic Panel:  Recent Labs Lab 07/11/14 1605  NA  135  K 5.0  CL 103  CO2 23  GLUCOSE 116*  BUN 16  CREATININE 0.96  CALCIUM 9.6   Liver Function Tests:  Recent Labs Lab 07/11/14 1605  AST 32  ALT 26  ALKPHOS 69  BILITOT 0.8  PROT 7.8  ALBUMIN 4.6   No results for input(s): LIPASE, AMYLASE in the last 168 hours. No results for input(s): AMMONIA in the last 168 hours. CBC:  Recent Labs Lab 07/11/14 1605 07/11/14 1920 07/13/14 0425  WBC 9.5 10.6* 9.4  NEUTROABS 6.8  --   --   HGB 14.3 14.6 13.3  HCT 43.2 43.8 40.0  MCV 78.1 78.5 77.7*  PLT 75* 74* 56*   Cardiac Enzymes:  Recent Labs Lab 07/11/14 1920  TROPONINI 0.16*   BNP (last 3 results)  Recent Labs  07/11/14 1605  BNP 194.5*    ProBNP (last 3 results) No results for input(s): PROBNP in the last 8760 hours.  CBG: No results for input(s): GLUCAP in the last 168 hours.  Recent Results (from the past 240 hour(s))  MRSA PCR Screening     Status: None   Collection Time: 07/12/14  1:39 AM  Result Value Ref Range Status   MRSA by PCR NEGATIVE NEGATIVE Final    Comment:        The GeneXpert MRSA Assay (FDA approved for NASAL specimens only), is one component of a comprehensive MRSA colonization surveillance program. It is not intended to diagnose MRSA infection nor to guide or monitor treatment for MRSA infections.  Studies: Ct Angio Chest Pe W/cm &/or Wo Cm  07/11/2014   CLINICAL DATA:  Two day history of shortness of breath  EXAM: CT ANGIOGRAPHY CHEST WITH CONTRAST  TECHNIQUE: Multidetector CT imaging of the chest was performed using the standard protocol during bolus administration of intravenous contrast. Multiplanar CT image reconstructions and MIPs were obtained to evaluate the vascular anatomy.  CONTRAST:  173mL OMNIPAQUE IOHEXOL 350 MG/ML SOLN  COMPARISON:  Chest radiograph Aug 09, 2014  FINDINGS: There is extensive pulmonary embolus bilaterally arising from both main pulmonary arteries and extending into multiple upper and lower lobe  pulmonary artery branches bilaterally. There is right heart strain with right ventricle to left ventricle ratio of 1.42, normal less than 0.9.  There is no appreciable thoracic aortic aneurysm or dissection.  There is patchy atelectasis in both lower lobes. There is a wedge-shaped defect in the posterior right base, suspicious for focal infarct. There is mild patchy infiltrate in the superior segment of the right lower lobe, possibly a second infarct.  There is no appreciable thoracic adenopathy. The pericardium is not thickened. Visualized thyroid appears normal.  In the visualized upper abdomen, no lesions are identified.  There are no blastic or lytic bone lesions.  Review of the MIP images confirms the above findings.  IMPRESSION: Extensive central pulmonary emboli. Positive for acute PE with CT evidence of right heart strain (RV/LV Ratio = 1.42) consistent with at least submassive (intermediate risk) PE. The presence of right heart strain has been associated with an increased risk of morbidity and mortality. Please activate Code PE by paging [phone].  Probable infarct posterior right base. Question second infarct superior segment right lower lobe. Areas of patchy atelectatic change bilaterally.  No demonstrable adenopathy.  Critical Value/emergent results were called by telephone at the time of interpretation on 07/11/2014 at 5:00 pm to [facility] , who verbally acknowledged these results.   Electronically Signed   By: Lowella Grip III M.D.   On: 07/11/2014 17:00    Scheduled Meds: . fluticasone  1 spray Each Nare Daily  . OxyCODONE  40 mg Oral Q12H  . sodium chloride  3 mL Intravenous Q12H   Continuous Infusions: . heparin 2,000 Units/hr (07/13/14 0900)     Time spent: > 35 minutes    [name]  [facility] Pager [phone]. If 7PM-7AM, please contact night-coverage at [email or web address], password [facility] 07/13/2014, 11:40 AM  LOS: 2 days

## 2014-07-14 ENCOUNTER — Inpatient Hospital Stay (HOSPITAL_COMMUNITY): Payer: 59

## 2014-07-14 DIAGNOSIS — I82401 Acute embolism and thrombosis of unspecified deep veins of right lower extremity: Secondary | ICD-10-CM

## 2014-07-14 DIAGNOSIS — R5081 Fever presenting with conditions classified elsewhere: Secondary | ICD-10-CM

## 2014-07-14 LAB — HEMOGLOBIN A1C
HEMOGLOBIN A1C: 5.8 % — AB (ref 4.8–5.6)
Mean Plasma Glucose: 120 mg/dL

## 2014-07-14 LAB — CBC
HCT: 40.1 % (ref 39.0–52.0)
HEMOGLOBIN: 13.5 g/dL (ref 13.0–17.0)
MCH: 26.5 pg (ref 26.0–34.0)
MCHC: 33.7 g/dL (ref 30.0–36.0)
MCV: 78.8 fL (ref 78.0–100.0)
Platelets: 48 10*3/uL — ABNORMAL LOW (ref 150–400)
RBC: 5.09 MIL/uL (ref 4.22–5.81)
RDW: 14.2 % (ref 11.5–15.5)
WBC: 8.8 10*3/uL (ref 4.0–10.5)

## 2014-07-14 LAB — HEPARIN LEVEL (UNFRACTIONATED): Heparin Unfractionated: 0.37 IU/mL (ref 0.30–0.70)

## 2014-07-14 MED ORDER — DEXTROSE 5 % IV SOLN
2.0000 g | Freq: Three times a day (TID) | INTRAVENOUS | Status: DC
  Administered 2014-07-14 – 2014-07-15 (×3): 2 g via INTRAVENOUS
  Filled 2014-07-14 (×5): qty 2

## 2014-07-14 MED ORDER — DEXTROSE 5 % IV SOLN
500.0000 mg | INTRAVENOUS | Status: DC
  Filled 2014-07-14: qty 500

## 2014-07-14 MED ORDER — VANCOMYCIN HCL 10 G IV SOLR
2000.0000 mg | Freq: Once | INTRAVENOUS | Status: AC
  Administered 2014-07-14: 2000 mg via INTRAVENOUS
  Filled 2014-07-14: qty 2000

## 2014-07-14 MED ORDER — CEFTRIAXONE SODIUM IN DEXTROSE 20 MG/ML IV SOLN
1.0000 g | INTRAVENOUS | Status: DC
  Filled 2014-07-14: qty 50

## 2014-07-14 MED ORDER — VANCOMYCIN HCL IN DEXTROSE 1-5 GM/200ML-% IV SOLN
1000.0000 mg | Freq: Two times a day (BID) | INTRAVENOUS | Status: DC
  Administered 2014-07-15: 1000 mg via INTRAVENOUS
  Filled 2014-07-14 (×3): qty 200

## 2014-07-14 NOTE — Progress Notes (Signed)
ANTICOAGULATION CONSULT NOTE - Follow Up Consult  Pharmacy Consult for heparin Indication: pulmonary embolus and DVT, hypercoaguable state w/ hx of atrial fibrillation and lupus anticoagulant  No Known Allergies  Patient Measurements: Height: 6\' 4"  (193 cm) Weight: 258 lb 13.1 oz (117.4 kg) IBW/kg (Calculated) : 86.8 Heparin Dosing Weight: 111kg  Vital Signs: Temp: 98.5 F (36.9 C) (04/11 0700) Temp Source: Oral (04/11 0700) BP: 119/75 mmHg (04/11 0313) Pulse Rate: 75 (04/11 0313)  Labs:  Recent Labs  07/11/14 1605 07/11/14 1920  07/12/14 1030 07/13/14 0425 07/13/14 1330 07/14/14 0311  HGB 14.3 14.6  --   --  13.3  --  13.5  HCT 43.2 43.8  --   --  40.0  --  40.1  PLT 75* 74*  --   --  56*  --  48*  APTT 80*  --   --   --   --  >200*  --   LABPROT 23.7*  --   --   --   --  22.4*  --   INR 2.09*  --   --   --   --  1.95*  --   HEPARINUNFRC  --   --   < > 0.37 0.46  --  0.37  CREATININE 0.96  --   --   --   --   --   --   TROPONINI  --  0.16*  --   --   --   --   --   < > = values in this interval not displayed.  Estimated Creatinine Clearance: 121.7 mL/min (by C-G formula based on Cr of 0.96).   Medications:  Scheduled:  . fluticasone  1 spray Each Nare Daily  . OxyCODONE  40 mg Oral Q12H  . sodium chloride  3 mL Intravenous Q12H   Infusions:  . heparin 2,000 Units/hr (07/14/14 0600)    Assessment: 54 y.o. male  with acute onset of shortness of breath and dyspnea on exertion 2 days prior to admission. Patient is on chronic warfarin PTA for lupus anticoagulant and atrial fibrillation. Patient found to have extensive PE and DVT, heparin gtt started. HL remains therapeutic this AM at 0.37. Hgb 13.5, plts 74>>56>>48, no bleeding noted.  Goal of Therapy:  Heparin level 0.3-0.7 units/ml Monitor platelets by anticoagulation protocol: Yes   Plan:  Continue heparin at 2000units/hr Daily HL/CBC Monitor s/sx of bleeding  Thank you. Anette Guarneri,  PharmD [phone]  07/14/2014 .9:03 AM

## 2014-07-14 NOTE — Care Management Note (Signed)
    Page 1 of 1   07/18/2014     12:46:00 PM CARE MANAGEMENT NOTE 07/18/2014  Patient:  Lee Phillips, Lee Phillips   Account Number:  [id number]  Date Initiated:  07/14/2014  Documentation initiated by:  Whitman Hero  Subjective/Objective Assessment:   PTA from home with wife, admitted with PE/DVT, hx  of lupus anticoagulant with hypercoaguable state.     Action/Plan:   Return to home when medically stable.CM to f/u with d/c disposition.   Anticipated DC Date:  07/18/2014   Anticipated DC Plan:  [place]  CM consult      Choice offered to / List presented to:             Status of service:  Completed, signed off Medicare Important Message given?  NO (If response is "NO", the following Medicare IM given date fields will be blank) Date Medicare IM given:   Medicare IM given by:   Date Additional Medicare IM given:   Additional Medicare IM given by:    Discharge Disposition:  HOME/SELF CARE  Per UR Regulation:  Reviewed for med. necessity/level of care/duration of stay  If discussed at [place] of Stay Meetings, dates discussed:    Comments:  [facility]    Sharon(wife), [phone]  07/17/2014 @ [place], [place]  [phone] CM provided Xarelto pamplet to pt which includes savings card ( zero  co- pay/month).  07/17/2014 @ [place] RN,BSN,CM [phone] Benefit check for Xarelto: [employer] is a TIER 2 MEDICATION. Both dosages of 15mg  and 20mg  do not require a prior authorization. The retail pharmacy co-payment for both dosages is $25.00.

## 2014-07-14 NOTE — Progress Notes (Signed)
UR COMPLETED  

## 2014-07-14 NOTE — Progress Notes (Signed)
PULMONARY  / CRITICAL CARE MEDICINE CONSULTATION   Name: Lee Phillips MRN: [id number] DOB: 07-01-1960    ADMISSION DATE:  07/11/2014 CONSULTATION DATE: 07/11/2014  REQUESTING CLINICIAN: Dr. Arnoldo Morale PRIMARY SERVICE: TRH  CHIEF COMPLAINT:  SOB  BRIEF PATIENT DESCRIPTION: 54 M with Lupus Anticoagulant with PE while on Coumadin. PCCM consulted for possible co-management in setting of possible EKOS.   SIGNIFICANT EVENTS / STUDIES:  CT PE Protocol 4/9 --> PE  LINES / TUBES: PIV  CULTURES: None  ANTIBIOTICS: None  HISTORY OF PRESENT ILLNESS:  Lee Phillips is a 54 yo M with lupus anticoagulant who [facility] to [facility] on 4/8 with SOB. He was found to have PEs and was transferred to [facility] for possible EKOS. PCCM was consulted for possible co-management in the setting of EKOS. His shortness of breath has been present for several weeks. There is no associated chest pain, fever, or chills. He has been compliant with his warfarin.   SUBJECTIVE:   VITAL SIGNS: Temp:  [97.6 F (36.4 C)-98.5 F (36.9 C)] 98.5 F (36.9 C) (04/11 0730) Pulse Rate:  [75-97] 85 (04/11 0730) Resp:  [14-22] 22 (04/11 0730) BP: (119-134)/(75-89) 134/89 mmHg (04/11 1200) SpO2:  [90 %-98 %] 90 % (04/11 0730) HEMODYNAMICS:   VENTILATOR SETTINGS:   INTAKE / OUTPUT: Intake/Output      04/10 0701 - 04/11 0700 04/11 0701 - 04/12 0700   P.O. 1040    I.V. (mL/kg) 460 (3.9) 3 (0)   Total Intake(mL/kg) 1500 (12.8) 3 (0)   Urine (mL/kg/hr) 1525 (0.5) 300 (0.4)   Stool     Total Output 1525 300   Net -25 -297         PHYSICAL EXAMINATION: General:  WDWN M in NAD Neuro:  Intact HEENT:  Sclera anicteric, conjunctiva pink, MMM, OP Clear Neck: Trachea supple and mildine, (-) LAN or JVD Cardiovascular:  RRR, nS1/S2, (-) MRG Lungs:  CTAB Abdomen:  S/NT/ND/(+)BS Musculoskeletal:  (-) C/C/E Skin:  Intact  LABS:  CBC  Recent Labs Lab 07/11/14 1920 07/13/14 0425 07/14/14 0311  WBC 10.6* 9.4 8.8  HGB 14.6 13.3  13.5  HCT 43.8 40.0 40.1  PLT 74* 56* 48*   Coag's  Recent Labs Lab 07/11/14 1605 07/13/14 1330  APTT 80* >200*  INR 2.09* 1.95*   BMET  Recent Labs Lab 07/11/14 1605  NA 135  K 5.0  CL 103  CO2 23  BUN 16  CREATININE 0.96  GLUCOSE 116*   Electrolytes  Recent Labs Lab 07/11/14 1605  CALCIUM 9.6   Sepsis Markers No results for input(s): LATICACIDVEN, PROCALCITON, O2SATVEN in the last 168 hours. ABG No results for input(s): PHART, PCO2ART, PO2ART in the last 168 hours. Liver Enzymes  Recent Labs Lab 07/11/14 1605  AST 32  ALT 26  ALKPHOS 69  BILITOT 0.8  ALBUMIN 4.6   Cardiac Enzymes  Recent Labs Lab 07/11/14 1920  TROPONINI 0.16*   Glucose No results for input(s): GLUCAP in the last 168 hours.  Imaging No results found.  EKG: Reviewed  ASSESSMENT / PLAN:  PULMONARY A: Acute PE: Mildly elevated trops, BNP, and evidence of RV strain all suggestive of submassive PE. No emergent indication for intervention. P:   Heparin Drip and Long-Term AC plan per hematology IR to see today, INR is 1.95 will defer to IR to use EKOS or not, INR is elevated and platelet count is marginal.  Will defer to IR.  CARDIOVASCULAR A: Afib:  HTN P:   Monitor  Cont OP regimen when primary team comfortable  RENAL A: No acute issues  GASTROINTESTINAL A: No acute issues  HEMATOLOGIC A: Acute thrombocytopenia: Per Hem likely 2/2 consumption in setting of PE P:   Monitor  INFECTIOUS A: No acute issues  ENDOCRINE A: Elevated Glucose:  P:   Check A1c Monitor  NEUROLOGIC A: No acute issues  TODAY'S SUMMARY:   I personally reviewed chest CT, clot burden is not massive.  DVT on right from popliteal down.  Currently on heparin and INR drifting down.  To be evaluated by IR for EKOS.  Will defer to IR.  In the meantime, anti-coag per heme.  PCCM will sign off, please call back if needed.  I have personally obtained a history, examined the patient,  evaluated laboratory and imaging results, formulated the assessment and plan and placed orders.  [name], M.D. [facility] Pulmonary/Critical Care Medicine. Pager: [phone]. After hours pager: [phone].  07/14/2014, 12:45 PM

## 2014-07-14 NOTE — Progress Notes (Signed)
Patient ID: EADEN HETTINGER, male   DOB: 1961-02-23, 54 y.o.   MRN: [id number]   Dr Laurence Ferrari has talked in length to pt and wife concerning risks and benefits of PE thrombolysis/thrombectomy in this setting,.  Plt (48 today) continue to decrease; INR 1.95 yesterday Places pt at higher risk for possible bleed Consider evaluation for HIT Pt also sitting up in chair Not obviously SOB; 98% on RA (except lying down---desats slightly) VSS Feel not best interest for pt to move forward with thrombolysis at this time  Development of new DVT/BPE while on coumadin does create question for need for consideration of IVC filter  Rec: Hematology consult for possible long term plan for anticoagulation Pt with new onset fever today Did mention some dysuria last pm:  May need Urinalysis  Thank you

## 2014-07-14 NOTE — Progress Notes (Signed)
ANTIBIOTIC CONSULT NOTE - INITIAL  Pharmacy Consult for Ceftriaxone Indication: pneumonia  No Known Allergies  Patient Measurements: Height: 6\' 4"  (193 cm) Weight: 258 lb 13.1 oz (117.4 kg) IBW/kg (Calculated) : 86.8   Vital Signs: Temp: 100.3 F (37.9 C) (04/11 1556) Temp Source: Oral (04/11 1556) BP: 134/89 mmHg (04/11 1200) Pulse Rate: 85 (04/11 0730) Intake/Output from previous day: 04/10 0701 - 04/11 0700 In: 1500 [P.O.:1040; I.V.:460] Out: 1525 [Urine:1525] Intake/Output from this shift: Total I/O In: 83 [I.V.:83] Out: 370 [Urine:370]  Labs:  Recent Labs  07/11/14 1920 07/13/14 0425 07/14/14 0311  WBC 10.6* 9.4 8.8  HGB 14.6 13.3 13.5  PLT 74* 56* 48*   Estimated Creatinine Clearance: 121.7 mL/min (by C-G formula based on Cr of 0.96). No results for input(s): VANCOTROUGH, VANCOPEAK, VANCORANDOM, GENTTROUGH, GENTPEAK, GENTRANDOM, TOBRATROUGH, TOBRAPEAK, TOBRARND, AMIKACINPEAK, AMIKACINTROU, AMIKACIN in the last 72 hours.   Microbiology: Recent Results (from the past 720 hour(s))  MRSA PCR Screening     Status: None   Collection Time: 07/12/14  1:39 AM  Result Value Ref Range Status   MRSA by PCR NEGATIVE NEGATIVE Final    Comment:        The GeneXpert MRSA Assay (FDA approved for NASAL specimens only), is one component of a comprehensive MRSA colonization surveillance program. It is not intended to diagnose MRSA infection nor to guide or monitor treatment for MRSA infections.     Medical History: Past Medical History  Diagnosis Date  . Lupus anticoagulant with hypercoagulable state 05/16/2011  . Hypertension   . Irregular heart beat   . A-fib   . Lymphedema     Medications:  Prescriptions prior to admission  Medication Sig Dispense Refill Last Dose  . fluticasone (FLONASE) 50 MCG/ACT nasal spray Place 1 spray into both nostrils daily.   07/10/2014 at Unknown time  . HYDROcodone-acetaminophen (NORCO) 10-325 MG per tablet Take 10-325 mg by  mouth Every 4 hours.   07/11/2014 at 1400  . lisinopril-hydrochlorothiazide (PRINZIDE,ZESTORETIC) 20-12.5 MG per tablet Take 20 mg by mouth Daily.   07/11/2014 at 0500  . metoprolol (LOPRESSOR) 50 MG tablet Take 50 mg by mouth Twice daily.   07/10/2014 at 1800  . Multiple Vitamins-Minerals (MULTI-VITAMIN GUMMIES PO) Take 2 tablets by mouth daily.   07/10/2014 at Unknown time  . OXYCONTIN 40 MG 12 hr tablet Take 40 mg by mouth Twice daily.   07/11/2014 at 0900  . triamcinolone (NASACORT) 55 MCG/ACT AERO nasal inhaler Place 1 spray into the nose daily.   07/11/2014 at Unknown time  . warfarin (COUMADIN) 10 MG tablet Take 1 tablet (10 mg total) by mouth one time only at 6 PM. (Patient taking differently: Take 10 mg by mouth daily. ) 30 tablet 12 07/11/2014 at Unknown time   Assessment: 54 yo M admitted 07/11/2014 with DV thrombophlebitis and history of lupus anticoagulant with a hypercoaguable state/PE. Patient now with new fever and pharmacy consulted to start ceftriaxone.  Goal of Therapy:  Renal adjustment of medications  Plan:  1. Ceftriaxone 1 g IV q24h 2. Follow up SCr, UOP, cultures, clinical course and adjust as clinically indicated.   Thank you for allowing pharmacy to be a part of this patients care team.  Rowe Robert Pharm.D., BCPS, AQ-Cardiology Clinical Pharmacist 07/14/2014 5:22 PM Pager: [phone] Phone: [phone]

## 2014-07-14 NOTE — Progress Notes (Addendum)
[facility] PROGRESS NOTE  Lee Phillips BCW:[id number] DOB: 1960/04/06 DOA: 07/11/2014 PCP: Reginia Naas, MD  Assessment/Plan:   DEEP VENOUS THROMBOPHLEBITIS, HX OF/ Lupus anticoagulant with hypercoagulable state/Pulmonary embolism - No plans for more hypercoagulable workup per hematologist on last note - Hematology on board, pulmonology has deferred to hematologist whether or not patient will require IR procedure EKOS PE lysis. - IR consulted for consideration of EKOS PE lysis reduction of long term morbidity from pulmonary artery hypertension - doppler of lower extremities completed final report pending. Preliminary reports however report clot at right lower extremity  SOB/Fever - reported chest tightness - Patient spiked fever, will obtain sputum culture, blood culture x 2, chest x ray. Will cover with antibiotics at this juncture. This can always be deescalated. - continue supportive therapy.  Active Problems:   Essential hypertension - Currently holding hypertensive medications in lieu of submassive PE    Atrial fibrillation - currently on heparin gtt - rate controlled off of rate control medication    Thrombocytopenia - hematology on board and assisting. - continue daily cbc's    Elevated troponin - Most likely 2ary to PE, no chest pain reported today  Code Status:full Family Communication: discussed with family at bedside.  Disposition Plan: Pending improvement in condition   Consultants:  Radiology  Critical care  Hematology  Procedures:  pending   HPI/Subjective: The patient complaining of some chest tightness, denies any chest pain otherwise.  Objective: Filed Vitals:   07/14/14 1519  BP:   Pulse:   Temp: 99.8 F (37.7 C)  Resp:     Intake/Output Summary (Last 24 hours) at 07/14/14 1555 Last data filed at 07/14/14 1500  Gross per 24 hour  Intake    783 ml  Output   1270 ml  Net   -487 ml   Filed Weights   07/11/14 1723  07/12/14 0100  Weight: 121.564 kg (268 lb) 117.4 kg (258 lb 13.1 oz)    Exam:   General:  Pt in nad, alert and awake  Cardiovascular: s1 and s2 present, no rubs  Respiratory: cta bl, no wheezes, equal chest rise  Abdomen: soft, Nt, nd  Musculoskeletal: no cyanosis or clubbing on limited exam.  Data Reviewed: Basic Metabolic Panel:  Recent Labs Lab 07/11/14 1605  NA 135  K 5.0  CL 103  CO2 23  GLUCOSE 116*  BUN 16  CREATININE 0.96  CALCIUM 9.6   Liver Function Tests:  Recent Labs Lab 07/11/14 1605  AST 32  ALT 26  ALKPHOS 69  BILITOT 0.8  PROT 7.8  ALBUMIN 4.6   No results for input(s): LIPASE, AMYLASE in the last 168 hours. No results for input(s): AMMONIA in the last 168 hours. CBC:  Recent Labs Lab 07/11/14 1605 07/11/14 1920 07/13/14 0425 07/14/14 0311  WBC 9.5 10.6* 9.4 8.8  NEUTROABS 6.8  --   --   --   HGB 14.3 14.6 13.3 13.5  HCT 43.2 43.8 40.0 40.1  MCV 78.1 78.5 77.7* 78.8  PLT 75* 74* 56* 48*   Cardiac Enzymes:  Recent Labs Lab 07/11/14 1920  TROPONINI 0.16*   BNP (last 3 results)  Recent Labs  07/11/14 1605  BNP 194.5*    ProBNP (last 3 results) No results for input(s): PROBNP in the last 8760 hours.  CBG: No results for input(s): GLUCAP in the last 168 hours.  Recent Results (from the past 240 hour(s))  MRSA PCR Screening     Status: None  Collection Time: 07/12/14  1:39 AM  Result Value Ref Range Status   MRSA by PCR NEGATIVE NEGATIVE Final    Comment:        The GeneXpert MRSA Assay (FDA approved for NASAL specimens only), is one component of a comprehensive MRSA colonization surveillance program. It is not intended to diagnose MRSA infection nor to guide or monitor treatment for MRSA infections.      Studies: No results found.  Scheduled Meds: . azithromycin  500 mg Intravenous Q24H  . fluticasone  1 spray Each Nare Daily  . OxyCODONE  40 mg Oral Q12H  . sodium chloride  3 mL Intravenous Q12H    Continuous Infusions: . heparin 2,000 Units/hr (07/14/14 1227)     Time spent: > 35 minutes    [name]  [facility] Pager [phone]. If 7PM-7AM, please contact night-coverage at [email or web address], password [facility] 07/14/2014, 3:55 PM  LOS: 3 days     Pt developed fever today. We'll place on broad spectrum antibiotics and broadened to cefepime and vancomycin.

## 2014-07-14 NOTE — Progress Notes (Signed)
Referring Physician(s): PCCM  Subjective:  Pt with remote Hx DVT 2008 Has been on coumadin since then Developed shortness of breath 07/10/14 CTA + B PE with Rt Heart strain Known lupus anticoagulant and hypercoaguable state Was evaluated by Dr Barbie Banner and Dr Melvyn Novas PE thrombolysis is in consideration Pt is up in chair In No obvious distress O2 sat 98 % RA Does sl desat when lying flat I have discussed with PCCM PA They will evaluate pt again today and let us know if need to move forward  Allergies: Review of patient's allergies indicates no known allergies.  Medications: Prior to Admission medications   Medication Sig Start Date End Date Taking? Authorizing Provider  fluticasone (FLONASE) 50 MCG/ACT nasal spray Place 1 spray into both nostrils daily.   Yes Historical Provider, MD  HYDROcodone-acetaminophen (NORCO) 10-325 MG per tablet Take 10-325 mg by mouth Every 4 hours. 04/18/11  Yes Historical Provider, MD  lisinopril-hydrochlorothiazide (PRINZIDE,ZESTORETIC) 20-12.5 MG per tablet Take 20 mg by mouth Daily. 03/31/11  Yes Historical Provider, MD  metoprolol (LOPRESSOR) 50 MG tablet Take 50 mg by mouth Twice daily. 04/11/11  Yes Historical Provider, MD  Multiple Vitamins-Minerals (MULTI-VITAMIN GUMMIES PO) Take 2 tablets by mouth daily.   Yes Historical Provider, MD  OXYCONTIN 40 MG 12 hr tablet Take 40 mg by mouth Twice daily. 05/13/11  Yes Historical Provider, MD  triamcinolone (NASACORT) 55 MCG/ACT AERO nasal inhaler Place 1 spray into the nose daily.   Yes Historical Provider, MD  warfarin (COUMADIN) 10 MG tablet Take 1 tablet (10 mg total) by mouth one time only at 6 PM. Patient taking differently: Take 10 mg by mouth daily.  09/19/13  Yes Volanda Napoleon, MD     Vital Signs: BP 134/89 mmHg  Pulse 85  Temp(Src) 98.5 F (36.9 C) (Oral)  Resp 22  Ht 6\' 4"  (1.93 m)  Wt 117.4 kg (258 lb 13.1 oz)  BMI 31.52 kg/m2  SpO2 90%  Physical Exam  Constitutional: He is oriented to  person, place, and time. He appears well-developed and well-nourished.  Cardiovascular: Normal rate and regular rhythm.   Pulmonary/Chest: Effort normal and breath sounds normal.  Neurological: He is alert and oriented to person, place, and time.  Skin: Skin is warm and dry.  Psychiatric: He has a normal mood and affect. His behavior is normal. Judgment and thought content normal.  Nursing note and vitals reviewed.   Imaging: Dg Chest 2 View  07/10/2014   CLINICAL DATA:  Shortness of breath with exertion for 3 days, history hypertension, atrial fibrillation, former smoker, hypercoagulable with lupus anticoagulant  EXAM: CHEST  2 VIEW  COMPARISON:  01/11/2005  FINDINGS: Upper normal heart size.  Normal mediastinal contours and pulmonary vascularity.  Minimal chronic peribronchial thickening.  No acute infiltrate, pleural effusion or pneumothorax.  Bones unremarkable.  IMPRESSION: Minimal chronic bronchitic changes without infiltrate.   Electronically Signed   By: Lavonia Dana M.D.   On: 07/10/2014 18:32   Ct Angio Chest Pe W/cm &/or Wo Cm  07/11/2014   CLINICAL DATA:  Two day history of shortness of breath  EXAM: CT ANGIOGRAPHY CHEST WITH CONTRAST  TECHNIQUE: Multidetector CT imaging of the chest was performed using the standard protocol during bolus administration of intravenous contrast. Multiplanar CT image reconstructions and MIPs were obtained to evaluate the vascular anatomy.  CONTRAST:  164mL OMNIPAQUE IOHEXOL 350 MG/ML SOLN  COMPARISON:  Chest radiograph Aug 09, 2014  FINDINGS: There is extensive pulmonary embolus bilaterally  arising from both main pulmonary arteries and extending into multiple upper and lower lobe pulmonary artery branches bilaterally. There is right heart strain with right ventricle to left ventricle ratio of 1.42, normal less than 0.9.  There is no appreciable thoracic aortic aneurysm or dissection.  There is patchy atelectasis in both lower lobes. There is a wedge-shaped defect  in the posterior right base, suspicious for focal infarct. There is mild patchy infiltrate in the superior segment of the right lower lobe, possibly a second infarct.  There is no appreciable thoracic adenopathy. The pericardium is not thickened. Visualized thyroid appears normal.  In the visualized upper abdomen, no lesions are identified.  There are no blastic or lytic bone lesions.  Review of the MIP images confirms the above findings.  IMPRESSION: Extensive central pulmonary emboli. Positive for acute PE with CT evidence of right heart strain (RV/LV Ratio = 1.42) consistent with at least submassive (intermediate risk) PE. The presence of right heart strain has been associated with an increased risk of morbidity and mortality. Please activate Code PE by paging [phone].  Probable infarct posterior right base. Question second infarct superior segment right lower lobe. Areas of patchy atelectatic change bilaterally.  No demonstrable adenopathy.  Critical Value/emergent results were called by telephone at the time of interpretation on 07/11/2014 at 5:00 pm to [facility] , who verbally acknowledged these results.   Electronically Signed   By: Lowella Grip III M.D.   On: 07/11/2014 17:00    Labs:  CBC:  Recent Labs  07/11/14 1605 07/11/14 1920 07/13/14 0425 07/14/14 0311  WBC 9.5 10.6* 9.4 8.8  HGB 14.3 14.6 13.3 13.5  HCT 43.2 43.8 40.0 40.1  PLT 75* 74* 56* 48*    COAGS:  Recent Labs  09/19/13 0751 10/08/13 0756 07/11/14 1605 07/13/14 1330  INR 1.5* 2.2 2.09* 1.95*  APTT  --   --  80* >200*    BMP:  Recent Labs  07/11/14 1605  NA 135  K 5.0  CL 103  CO2 23  GLUCOSE 116*  BUN 16  CALCIUM 9.6  CREATININE 0.96  GFRNONAA >90  GFRAA >90    LIVER FUNCTION TESTS:  Recent Labs  07/11/14 1605  BILITOT 0.8  AST 32  ALT 26  ALKPHOS 69  PROT 7.8  ALBUMIN 4.6    Assessment and Plan:  B PE with Rt heart strain RLE DVT---while on coumadin PE  thrombolysis under consideration although pt is minimally symptomatic at this time Dr Nelda Marseille to evaluate pt today Will call IR if need to move forward with procedure  Signed: Decklyn Hyder A 07/14/2014, 12:39 PM   I spent a total of 15 Minutes in face to face in clinical consultation/evaluation, greater than 50% of which was counseling/coordinating care for B PE/Rt heart strain and RLDVT

## 2014-07-15 DIAGNOSIS — Z8672 Personal history of thrombophlebitis: Secondary | ICD-10-CM

## 2014-07-15 DIAGNOSIS — I2699 Other pulmonary embolism without acute cor pulmonale: Secondary | ICD-10-CM

## 2014-07-15 LAB — HEPARIN LEVEL (UNFRACTIONATED)
HEPARIN UNFRACTIONATED: 0.2 [IU]/mL — AB (ref 0.30–0.70)
Heparin Unfractionated: 0.3 IU/mL (ref 0.30–0.70)
Heparin Unfractionated: 0.27 IU/mL — ABNORMAL LOW (ref 0.30–0.70)

## 2014-07-15 LAB — BASIC METABOLIC PANEL
Anion gap: 11 (ref 5–15)
BUN: 11 mg/dL (ref 6–23)
CALCIUM: 8.8 mg/dL (ref 8.4–10.5)
CO2: 24 mmol/L (ref 19–32)
CREATININE: 1.05 mg/dL (ref 0.50–1.35)
Chloride: 101 mmol/L (ref 96–112)
GFR calc non Af Amer: 78 mL/min — ABNORMAL LOW (ref >90)
Glucose, Bld: 101 mg/dL — ABNORMAL HIGH (ref 70–99)
Potassium: 4.5 mmol/L (ref 3.5–5.1)
Sodium: 136 mmol/L (ref 135–145)

## 2014-07-15 LAB — CBC
HCT: 40.6 % (ref 39.0–52.0)
Hemoglobin: 13.4 g/dL (ref 13.0–17.0)
MCH: 25.9 pg — ABNORMAL LOW (ref 26.0–34.0)
MCHC: 33 g/dL (ref 30.0–36.0)
MCV: 78.4 fL (ref 78.0–100.0)
PLATELETS: 33 10*3/uL — AB (ref 150–400)
RBC: 5.18 MIL/uL (ref 4.22–5.81)
RDW: 14.1 % (ref 11.5–15.5)
WBC: 9.7 10*3/uL (ref 4.0–10.5)

## 2014-07-15 MED ORDER — HEPARIN BOLUS VIA INFUSION
2000.0000 [IU] | INTRAVENOUS | Status: AC
  Administered 2014-07-15: 2000 [IU] via INTRAVENOUS
  Filled 2014-07-15: qty 2000

## 2014-07-15 MED ORDER — HEPARIN (PORCINE) IN NACL 100-0.45 UNIT/ML-% IJ SOLN
2600.0000 [IU]/h | INTRAMUSCULAR | Status: DC
  Administered 2014-07-15 (×2): 2200 [IU]/h via INTRAVENOUS
  Administered 2014-07-16 – 2014-07-17 (×3): 2600 [IU]/h via INTRAVENOUS
  Filled 2014-07-15 (×6): qty 250

## 2014-07-15 MED ORDER — HYDROCODONE-ACETAMINOPHEN 10-325 MG PO TABS
1.0000 | ORAL_TABLET | ORAL | Status: DC | PRN
  Administered 2014-07-15 – 2014-07-18 (×14): 1 via ORAL
  Filled 2014-07-15 (×14): qty 1

## 2014-07-15 MED ORDER — SALINE SPRAY 0.65 % NA SOLN
1.0000 | NASAL | Status: DC | PRN
  Filled 2014-07-15: qty 44

## 2014-07-15 NOTE — Progress Notes (Signed)
[facility] PROGRESS NOTE  RANKIN COOLMAN JXB:[id number] DOB: [birth year] DOA: 07/11/2014 PCP: Reginia Naas, MD  Assessment/Plan:   DEEP VENOUS THROMBOPHLEBITIS, HX OF/ Lupus anticoagulant with hypercoagulable state/Pulmonary embolism - No plans for more hypercoagulable workup per hematologist on last note - No plans for IR procedure EKOS PE lysis. - IR consulted for consideration of EKOS PE lysis reduction of long term morbidity from pulmonary artery hypertension - doppler of lower extremities reports right lower extremity DVT  SOB/Fever - Resolved fever could have been secondary to atelectasis - Patient spiked fever, will obtain sputum culture, blood culture x 2, chest x ray reported low volumes without focal acute findings..   - continue supportive therapy. - Placed on broad-spectrum antibiotics although clinical picture does not correlate with pneumonia. We'll discontinue antibiotics  Active Problems:   Essential hypertension - Currently continuing to hold hypertensive medications in lieu of submassive PE    Atrial fibrillation - currently on heparin gtt - rate controlled off of rate control medication    Thrombocytopenia - hematology on board and assisting. - continue daily cbc's    Elevated troponin - Most likely 2ary to PE, no chest pain reported today  Code Status:full Family Communication: discussed with family at bedside.  Disposition Plan: Pending improvement in condition   Consultants:  Radiology  Critical care  Hematology  Procedures:  pending   HPI/Subjective: No new complaints today  Objective: Filed Vitals:   07/15/14 1300  BP:   Pulse:   Temp: 98.6 F (37 C)  Resp:     Intake/Output Summary (Last 24 hours) at 07/15/14 1616 Last data filed at 07/15/14 1100  Gross per 24 hour  Intake  536.2 ml  Output   1100 ml  Net -563.8 ml   Filed Weights   07/11/14 1723 07/12/14 0100  Weight: 121.564 kg (268 lb) 117.4 kg (258 lb  13.1 oz)    Exam:   General:  Pt in nad, alert and awake  Cardiovascular: s1 and s2 present, no rubs  Respiratory: cta bl, no wheezes, equal chest rise  Abdomen: soft, Nt, nd  Musculoskeletal: no cyanosis or clubbing on limited exam.  Data Reviewed: Basic Metabolic Panel:  Recent Labs Lab 07/11/14 1605 07/15/14 0309  NA 135 136  K 5.0 4.5  CL 103 101  CO2 23 24  GLUCOSE 116* 101*  BUN 16 11  CREATININE 0.96 1.05  CALCIUM 9.6 8.8   Liver Function Tests:  Recent Labs Lab 07/11/14 1605  AST 32  ALT 26  ALKPHOS 69  BILITOT 0.8  PROT 7.8  ALBUMIN 4.6   No results for input(s): LIPASE, AMYLASE in the last 168 hours. No results for input(s): AMMONIA in the last 168 hours. CBC:  Recent Labs Lab 07/11/14 1605 07/11/14 1920 07/13/14 0425 07/14/14 0311 07/15/14 0309  WBC 9.5 10.6* 9.4 8.8 9.7  NEUTROABS 6.8  --   --   --   --   HGB 14.3 14.6 13.3 13.5 13.4  HCT 43.2 43.8 40.0 40.1 40.6  MCV 78.1 78.5 77.7* 78.8 78.4  PLT 75* 74* 56* 48* 33*   Cardiac Enzymes:  Recent Labs Lab 07/11/14 1920  TROPONINI 0.16*   BNP (last 3 results)  Recent Labs  07/11/14 1605  BNP 194.5*    ProBNP (last 3 results) No results for input(s): PROBNP in the last 8760 hours.  CBG: No results for input(s): GLUCAP in the last 168 hours.  Recent Results (from the past 240 hour(s))  MRSA PCR  Screening     Status: None   Collection Time: 07/12/14  1:39 AM  Result Value Ref Range Status   MRSA by PCR NEGATIVE NEGATIVE Final    Comment:        The GeneXpert MRSA Assay (FDA approved for NASAL specimens only), is one component of a comprehensive MRSA colonization surveillance program. It is not intended to diagnose MRSA infection nor to guide or monitor treatment for MRSA infections.      Studies: Dg Chest 2 View  07/14/2014   CLINICAL DATA:  Sudden onset fever  EXAM: CHEST  2 VIEW  COMPARISON:  Chest CT 07/11/2014  FINDINGS: Lungs are hypoaerated with  crowding of the bronchovascular markings. Heart size normal. No pleural effusion. No acute osseous abnormality.  IMPRESSION: Low volumes without focal acute finding.   Electronically Signed   By: Conchita Paris M.D.   On: 07/14/2014 21:04    Scheduled Meds: . ceFEPime (MAXIPIME) IV  2 g Intravenous 3 times per day  . fluticasone  1 spray Each Nare Daily  . OxyCODONE  40 mg Oral Q12H  . sodium chloride  3 mL Intravenous Q12H  . vancomycin  1,000 mg Intravenous Q12H   Continuous Infusions: . heparin 2,200 Units/hr (07/15/14 1605)     Time spent: > 35 minutes    [name]  [facility] Pager [phone]. If 7PM-7AM, please contact night-coverage at [email or web address], password [facility] 07/15/2014, 4:16 PM  LOS: 4 days

## 2014-07-15 NOTE — Progress Notes (Signed)
ANTICOAGULATION CONSULT NOTE - Follow Up Consult  Pharmacy Consult for heparin Indication: pulmonary embolus and DVT, hypercoaguable state w/ hx of atrial fibrillation and lupus anticoagulant  No Known Allergies  Patient Measurements: Height: 6\' 4"  (193 cm) Weight: 258 lb 13.1 oz (117.4 kg) IBW/kg (Calculated) : 86.8 Heparin Dosing Weight: 111 kg  Vital Signs: Temp: 98.6 F (37 C) (04/12 1300) Temp Source: Oral (04/12 1300) BP: 125/73 mmHg (04/12 0700) Pulse Rate: 83 (04/12 0311)  Labs:  Recent Labs  07/13/14 0425 07/13/14 1330 07/14/14 0311 07/15/14 0309  HGB 13.3  --  13.5 13.4  HCT 40.0  --  40.1 40.6  PLT 56*  --  48* 33*  APTT  --  >200*  --   --   LABPROT  --  22.4*  --   --   INR  --  1.95*  --   --   HEPARINUNFRC 0.46  --  0.37 0.20*  CREATININE  --   --   --  1.05    Estimated Creatinine Clearance: 111.3 mL/min (by C-G formula based on Cr of 1.05).   Medications:  Scheduled:  . ceFEPime (MAXIPIME) IV  2 g Intravenous 3 times per day  . fluticasone  1 spray Each Nare Daily  . OxyCODONE  40 mg Oral Q12H  . sodium chloride  3 mL Intravenous Q12H  . vancomycin  1,000 mg Intravenous Q12H   Infusions:  . heparin 2,200 Units/hr (07/15/14 0600)    Assessment: 54 yo male with pulmonary embolus and DVT, hypercoaguable state w/ hx of atrial fibrillation and lupus anticoagulant is currently on therapeutic heparin but heparin level is on low side @ 0.3.  Goal of Therapy:  Heparin level 0.3-0.7 units/ml Monitor platelets by anticoagulation protocol: Yes   Plan:  - increase heparin drip to 2350 units/hr - 6 hr heparin level  Llana Deshazo, Tsz-Yin 07/15/2014,3:10 PM

## 2014-07-15 NOTE — Progress Notes (Signed)
ANTICOAGULATION CONSULT NOTE - Follow Up Consult  Pharmacy Consult for heparin Indication: pulmonary embolus and DVT  Labs:  Recent Labs  07/13/14 0425 07/13/14 1330 07/14/14 0311 07/15/14 0309 07/15/14 1604 07/15/14 2300  HGB 13.3  --  13.5 13.4  --   --   HCT 40.0  --  40.1 40.6  --   --   PLT 56*  --  48* 33*  --   --   APTT  --  >200*  --   --   --   --   LABPROT  --  22.4*  --   --   --   --   INR  --  1.95*  --   --   --   --   HEPARINUNFRC 0.46  --  0.37 0.20* 0.30 0.27*  CREATININE  --   --   --  1.05  --   --      Assessment: 54yo male remains subtherapeutic on heparin with lower level despite rate increase.  Goal of Therapy:  Heparin level 0.3-0.7 units/ml   Plan:  Will increase heparin gtt by 2 units/kg/hr to 2600 units/hr and check level with am labs.  Wynona Neat, PharmD, BCPS  07/15/2014,11:54 PM

## 2014-07-15 NOTE — Progress Notes (Addendum)
ANTICOAGULATION CONSULT NOTE - Follow Up Consult  Pharmacy Consult for heparin Indication: pulmonary embolus and DVT, hypercoaguable state w/ hx of atrial fibrillation and lupus anticoagulant  No Known Allergies  Patient Measurements: Height: 6\' 4"  (193 cm) Weight: 258 lb 13.1 oz (117.4 kg) IBW/kg (Calculated) : 86.8 Heparin Dosing Weight: 111kg  Vital Signs: Temp: 98.1 F (36.7 C) (04/12 0311) Temp Source: Oral (04/12 0311) BP: 118/76 mmHg (04/12 0311) Pulse Rate: 83 (04/12 0311)  Labs:  Recent Labs  07/13/14 0425 07/13/14 1330 07/14/14 0311 07/15/14 0309  HGB 13.3  --  13.5 13.4  HCT 40.0  --  40.1 40.6  PLT 56*  --  48* PENDING  APTT  --  >200*  --   --   LABPROT  --  22.4*  --   --   INR  --  1.95*  --   --   HEPARINUNFRC 0.46  --  0.37 0.20*    Estimated Creatinine Clearance: 121.7 mL/min (by C-G formula based on Cr of 0.96).   Medications:  Scheduled:  . ceFEPime (MAXIPIME) IV  2 g Intravenous 3 times per day  . fluticasone  1 spray Each Nare Daily  . OxyCODONE  40 mg Oral Q12H  . sodium chloride  3 mL Intravenous Q12H  . vancomycin  1,000 mg Intravenous Q12H   Infusions:  . heparin 2,000 Units/hr (07/15/14 2505)    Assessment: 54 y.o. male  with acute onset of shortness of breath and dyspnea on exertion 2 days prior to admission. Patient was on chronic warfarin PTA for lupus anticoagulant and atrial fibrillation. Patient found to have extensive PE and DVT.  Heparin gtt started on 07/11/14.  Heparin level dropped to 0.2, subtherapeutic on rate of 2000 units/hr. Previously therapeutic on this rate. RN reports heparin infusing without interruptions and no bleeding noted. Hgb 13.5, plts 74>>56>>48.  Today's CBC pending.  Goal of Therapy:  Heparin level 0.3-0.7 units/ml Monitor platelets by anticoagulation protocol: Yes   Plan:  Give bolus of heparin 2000 untis x1 Increase heparin drip to 2200 units/hr Check 6 hour heparin level Daily HL/CBC Monitor  s/sx of bleeding  Thank you. Nicole Cella, RPh Clinical Pharmacist Pager: [phone] 07/15/2014 .4:06 AM

## 2014-07-15 NOTE — Progress Notes (Signed)
Echocardiogram 2D Echocardiogram has been performed.  [name] 07/15/2014, 9:12 AM

## 2014-07-16 DIAGNOSIS — Z7901 Long term (current) use of anticoagulants: Secondary | ICD-10-CM

## 2014-07-16 LAB — CBC
HEMATOCRIT: 41.6 % (ref 39.0–52.0)
Hemoglobin: 13.8 g/dL (ref 13.0–17.0)
MCH: 25.7 pg — AB (ref 26.0–34.0)
MCHC: 33.2 g/dL (ref 30.0–36.0)
MCV: 77.5 fL — AB (ref 78.0–100.0)
PLATELETS: 31 10*3/uL — AB (ref 150–400)
RBC: 5.37 MIL/uL (ref 4.22–5.81)
RDW: 14.1 % (ref 11.5–15.5)
WBC: 8.1 10*3/uL (ref 4.0–10.5)

## 2014-07-16 LAB — HEPARIN LEVEL (UNFRACTIONATED): Heparin Unfractionated: 0.38 IU/mL (ref 0.30–0.70)

## 2014-07-16 MED ORDER — ASPIRIN 81 MG PO CHEW
162.0000 mg | CHEWABLE_TABLET | Freq: Every day | ORAL | Status: DC
  Administered 2014-07-16: 162 mg via ORAL
  Filled 2014-07-16: qty 2

## 2014-07-16 NOTE — Progress Notes (Signed)
Lee Phillips is well-known to me. He was on long-term Coumadin. When I had last seen him a couple years ago, I wanted him on aspirin. I want him on baby aspirin because of the lupus anticoagulant. He was not taking the baby aspirin. Regardless, I think this new thromboembolic episode where happened.  He has had issues financially with being able to afford any of the new oral anticoagulants. He now has a new job. 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 Hopefully, his insurance will now pay for one of the oral anticoagulants.  [name] on heparin. He's doing well on heparin. He has not been out of bed. I think he really needs to get out of bed and become more active.  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 He has a new clot in the right leg. I'm sure this is where everything started.  He denies any trauma. He is on no new medications. He's had no travel. He is active with his job.  I think if we can get him on either Xarelto or ELIQUIS, this would be reasonable.  We will need case manager to help out with respect to what insurance will cover. He does have limited financial last sets. I do not want to get him in any financial distress. This will really be a problem for him.  I does have a hard time figuring out why this happened now. I haven't seen him for 8 years and he has not had any problems with blood clots throughout the entire time that I see him.  He has never had a colonoscopy. I think he needs to have a colonoscopy. [name]. Again, I think a colonoscopy would be very helpful as an inpatient. I know that he is not anemic. However, I think a colonoscopy would not be a bad idea.  On his physical exam, his vital signs are all stable. His temperature is 98.1. Pulse 89. Blood pressure 115/74. His oxygen saturation is 94%. His lungs are clear. Cardiac exam regular rate and rhythm with no murmurs, rubs or bruits. Abdomen is soft. Has good bowel sounds. There is no  distention. There is no palpable liver or spleen tip. Extremities show some chronic nonpitting edema of the right leg. He's tender to palpation in the lower right leg. He does have a positive Homan's sign with the right leg. Skin exam shows no rashes, ecchymoses or petechia.  Again, I think that one of the new oral anticoagulants would be reasonable for him. I would not start him on this until he has his colonoscopy. Again, I think colonoscopy is reasonable in his situation you know he is not anemic and not having any obvious bowel issues. I don't think he needs scans to look for malignancy otherwise.  I also would try to increase his ambulatory status. I think it would be very helpful to get him out of bed and walking and see how he does.  It was nice to see him again. He actually looks quite good. I just feel bad that he had this issue since he has not had anything for probably 10 years.  I'll get him on the baby aspirin. I'll make sure that he takes this with food. Again, has a lupus anticoagulant. I don't think we have to recheck this. I don't to do any other hypercoagulable studies. It would not alter the fact that he needs lifelong anticoagulation.  We will continue to follow him  along.

## 2014-07-16 NOTE — Progress Notes (Signed)
ANTICOAGULATION CONSULT NOTE - Follow Up Consult  Pharmacy Consult for heparin Indication: pulmonary embolus and DVT, hypercoaguable state w/ hx of atrial fibrillation and lupus anticoagulant  No Known Allergies  Patient Measurements: Height: 6\' 4"  (193 cm) Weight: 258 lb 13.1 oz (117.4 kg) IBW/kg (Calculated) : 86.8 Heparin Dosing Weight: 111kg  Vital Signs: Temp: 98.9 F (37.2 C) (04/13 0729) Temp Source: Oral (04/13 0729) BP: 135/81 mmHg (04/13 0729) Pulse Rate: 89 (04/13 0331)  Labs:  Recent Labs  07/13/14 1330  07/14/14 0311 07/15/14 0309 07/15/14 1604 07/15/14 2300 07/16/14 0245  HGB  --   < > 13.5 13.4  --   --  13.8  HCT  --   --  40.1 40.6  --   --  41.6  PLT  --   --  48* 33*  --   --  31*  APTT >200*  --   --   --   --   --   --   LABPROT 22.4*  --   --   --   --   --   --   INR 1.95*  --   --   --   --   --   --   HEPARINUNFRC  --   < > 0.37 0.20* 0.30 0.27* 0.38  CREATININE  --   --   --  1.05  --   --   --   < > = values in this interval not displayed.  Estimated Creatinine Clearance: 111.3 mL/min (by C-G formula based on Cr of 1.05).   Medications:  Scheduled:  . aspirin  162 mg Oral Daily  . fluticasone  1 spray Each Nare Daily  . OxyCODONE  40 mg Oral Q12H  . sodium chloride  3 mL Intravenous Q12H   Infusions:  . heparin 2,600 Units/hr (07/16/14 0800)    Assessment: 54 y.o. male  with acute onset of shortness of breath and dyspnea on exertion 2 days prior to admission. Patient is on chronic warfarin PTA for lupus anticoagulant and atrial fibrillation. Patient found to have extensive PE and DVT with a therapeutic INR, heparin gtt started. HL now  therapeutic this AM at 0.37.   HgB is stable.  Platelets have continued to trend down -- now 31  Planning colonoscopy then switch to a NOAC?  Goal of Therapy:  Heparin level 0.3-0.7 units/ml Monitor platelets by anticoagulation protocol: Yes   Plan:  Continue heparin at 2600 units / hr Daily  HL/CBC Monitor s/sx of bleeding  Thank you. Anette Guarneri, PharmD [phone]  07/16/2014 .10:10 AM

## 2014-07-16 NOTE — Progress Notes (Signed)
[facility] PROGRESS NOTE  Lee Phillips GUR:[id number] DOB: 01-24-1961 DOA: 07/11/2014 PCP: Reginia Naas, MD  Assessment/Plan:   DEEP VENOUS THROMBOPHLEBITIS, HX OF/ Lupus anticoagulant with hypercoagulable state/Pulmonary embolism - No plans for more hypercoagulable workup per hematologist on last note - No plans for IR procedure EKOS PE lysis. - doppler of lower extremities reports right lower extremity DVT - Hematologist recommending inpatient colonoscopy prior to initiation of other anticoagulant options like xarelto or Eliquis  SOB/Fever - Resolved fever could have been secondary to atelectasis - Patient spiked fever, will obtain sputum culture, blood culture x 2, chest x ray reported low volumes without focal acute findings..   - continue supportive therapy. - Placed on broad-spectrum antibiotics although clinical picture does not correlate with pneumonia. We'll discontinued antibiotics - no further fevers reported.  Active Problems:   Essential hypertension - Currently continuing to hold hypertensive medications in lieu of submassive PE    Atrial fibrillation - currently on heparin gtt - rate controlled off of rate control medication    Thrombocytopenia - hematology on board and assisting. - continue daily cbc's    Elevated troponin - Most likely 2ary to PE, no chest pain reported today  Code Status:full Family Communication: discussed with family at bedside.  Disposition Plan: Pending improvement in condition and recommendations of specialist involved   Consultants:  Radiology  Critical care  Hematology  GI  Procedures:  pending   HPI/Subjective: No new complaints today. Patient inquiring about discharge.  Objective: Filed Vitals:   07/16/14 1541  BP:   Pulse:   Temp: 97.7 F (36.5 C)  Resp:     Intake/Output Summary (Last 24 hours) at 07/16/14 1554 Last data filed at 07/16/14 1453  Gross per 24 hour  Intake   1148 ml   Output   1850 ml  Net   -702 ml   Filed Weights   07/11/14 1723 07/12/14 0100  Weight: 121.564 kg (268 lb) 117.4 kg (258 lb 13.1 oz)    Exam:   General:  Pt in nad, alert and awake  Cardiovascular: s1 and s2 present, no rubs  Respiratory: cta bl, no wheezes, equal chest rise  Abdomen: soft, Nt, nd  Musculoskeletal: no cyanosis or clubbing on limited exam.  Data Reviewed: Basic Metabolic Panel:  Recent Labs Lab 07/11/14 1605 07/15/14 0309  NA 135 136  K 5.0 4.5  CL 103 101  CO2 23 24  GLUCOSE 116* 101*  BUN 16 11  CREATININE 0.96 1.05  CALCIUM 9.6 8.8   Liver Function Tests:  Recent Labs Lab 07/11/14 1605  AST 32  ALT 26  ALKPHOS 69  BILITOT 0.8  PROT 7.8  ALBUMIN 4.6   No results for input(s): LIPASE, AMYLASE in the last 168 hours. No results for input(s): AMMONIA in the last 168 hours. CBC:  Recent Labs Lab 07/11/14 1605 07/11/14 1920 07/13/14 0425 07/14/14 0311 07/15/14 0309 07/16/14 0245  WBC 9.5 10.6* 9.4 8.8 9.7 8.1  NEUTROABS 6.8  --   --   --   --   --   HGB 14.3 14.6 13.3 13.5 13.4 13.8  HCT 43.2 43.8 40.0 40.1 40.6 41.6  MCV 78.1 78.5 77.7* 78.8 78.4 77.5*  PLT 75* 74* 56* 48* 33* 31*   Cardiac Enzymes:  Recent Labs Lab 07/11/14 1920  TROPONINI 0.16*   BNP (last 3 results)  Recent Labs  07/11/14 1605  BNP 194.5*    ProBNP (last 3 results) No results for input(s): PROBNP in  the last 8760 hours.  CBG: No results for input(s): GLUCAP in the last 168 hours.  Recent Results (from the past 240 hour(s))  MRSA PCR Screening     Status: None   Collection Time: 07/12/14  1:39 AM  Result Value Ref Range Status   MRSA by PCR NEGATIVE NEGATIVE Final    Comment:        The GeneXpert MRSA Assay (FDA approved for NASAL specimens only), is one component of a comprehensive MRSA colonization surveillance program. It is not intended to diagnose MRSA infection nor to guide or monitor treatment for MRSA infections.    Culture, blood (routine x 2)     Status: None (Preliminary result)   Collection Time: 07/14/14  6:03 PM  Result Value Ref Range Status   Specimen Description BLOOD RIGHT HAND  Final   Special Requests BOTTLES DRAWN AEROBIC ONLY [place]  Final   Culture   Final           BLOOD CULTURE RECEIVED NO GROWTH TO DATE CULTURE WILL BE HELD FOR 5 DAYS BEFORE ISSUING A FINAL NEGATIVE REPORT Performed at [facility]    Report Status PENDING  Incomplete  Culture, blood (routine x 2)     Status: None (Preliminary result)   Collection Time: 07/14/14  6:18 PM  Result Value Ref Range Status   Specimen Description BLOOD RIGHT HAND  Final   Special Requests BOTTLES DRAWN AEROBIC AND ANAEROBIC 5CC  Final   Culture   Final           BLOOD CULTURE RECEIVED NO GROWTH TO DATE CULTURE WILL BE HELD FOR 5 DAYS BEFORE ISSUING A FINAL NEGATIVE REPORT Performed at [facility]    Report Status PENDING  Incomplete     Studies: Dg Chest 2 View  07/14/2014   CLINICAL DATA:  Sudden onset fever  EXAM: CHEST  2 VIEW  COMPARISON:  Chest CT 07/11/2014  FINDINGS: Lungs are hypoaerated with crowding of the bronchovascular markings. Heart size normal. No pleural effusion. No acute osseous abnormality.  IMPRESSION: Low volumes without focal acute finding.   Electronically Signed   By: Conchita Paris M.D.   On: 07/14/2014 21:04    Scheduled Meds: . aspirin  162 mg Oral Daily  . fluticasone  1 spray Each Nare Daily  . OxyCODONE  40 mg Oral Q12H  . sodium chloride  3 mL Intravenous Q12H   Continuous Infusions: . heparin 2,600 Units/hr (07/16/14 1323)     Time spent: > 35 minutes    [name]  [facility] Pager [phone]. If 7PM-7AM, please contact night-coverage at [email or web address], password [facility] 07/16/2014, 3:54 PM  LOS: 5 days

## 2014-07-16 NOTE — Consult Note (Signed)
Unassigned patient Reason for Consult: Needs colon cancer screening. Referring Physician: [facility]-Dr. Wendee Beavers.  Lee Phillips is an 54 y.o. male.  HPI: 54 year old white male, with a recent PE and a DVT, has a history of a Lupus anticoagulant and atrial fibrillation has been on Coumadin for several years. He has a family history of colon cancer in his father who died of it at the age of 33. He has never had a baseline colonoscopy. He denies having any melena, hematochezia, constipation or diarrhea. He has some indigestion occasional but denies having any dysphagia or odynophagia. He has a good appetite and his weight has been stable. He spiked a fever but that has resolved.    Past Medical History  Diagnosis Date  . Lupus anticoagulant with hypercoagulable state 05/16/2011  . Hypertension   . Irregular heart beat   . A-fib   . Lymphedema    History reviewed. No pertinent past surgical history.  Family History  Problem Relation Age of Onset  . Colon cancer Father   . Skin cancer Mother   . Clotting disorder Neg Hx   . Lupus Neg Hx   . CAD Neg Hx   . Diabetes Mellitus II Neg Hx    Social History:  reports that he quit smoking about 36 years ago. His smoking use included Cigarettes. He started smoking about 41 years ago. He has a 7.5 pack-year smoking history. He has never used smokeless tobacco. He reports that he does not drink alcohol or use illicit drugs.  Allergies: No Known Allergies  Medications: I have reviewed the patient's current medications.  Results for orders placed or performed during the hospital encounter of 07/11/14 (from the past 48 hour(s))  Culture, blood (routine x 2)     Status: None (Preliminary result)   Collection Time: 07/14/14  6:03 PM  Result Value Ref Range   Specimen Description BLOOD RIGHT HAND    Special Requests BOTTLES DRAWN AEROBIC ONLY [place]    Culture             BLOOD CULTURE RECEIVED NO GROWTH TO DATE CULTURE WILL BE HELD FOR 5 DAYS BEFORE ISSUING  A FINAL NEGATIVE REPORT Performed at [facility]    Report Status PENDING   Culture, blood (routine x 2)     Status: None (Preliminary result)   Collection Time: 07/14/14  6:18 PM  Result Value Ref Range   Specimen Description BLOOD RIGHT HAND    Special Requests BOTTLES DRAWN AEROBIC AND ANAEROBIC 5CC    Culture             BLOOD CULTURE RECEIVED NO GROWTH TO DATE CULTURE WILL BE HELD FOR 5 DAYS BEFORE ISSUING A FINAL NEGATIVE REPORT Performed at [facility]    Report Status PENDING   CBC     Status: Abnormal   Collection Time: 07/15/14  3:09 AM  Result Value Ref Range   WBC 9.7 4.0 - 10.5 K/uL   RBC 5.18 4.22 - 5.81 MIL/uL   Hemoglobin 13.4 13.0 - 17.0 g/dL   HCT 40.6 39.0 - 52.0 %   MCV 78.4 78.0 - 100.0 fL   MCH 25.9 (L) 26.0 - 34.0 pg   MCHC 33.0 30.0 - 36.0 g/dL   RDW 14.1 11.5 - 15.5 %   Platelets 33 (L) 150 - 400 K/uL    Comment: DELTA CHECK NOTED REPEATED TO VERIFY CONSISTENT WITH PREVIOUS RESULT   Heparin level (unfractionated)     Status:  Abnormal   Collection Time: 07/15/14  3:09 AM  Result Value Ref Range   Heparin Unfractionated 0.20 (L) 0.30 - 0.70 IU/mL    Comment:        IF HEPARIN RESULTS ARE BELOW EXPECTED VALUES, AND PATIENT DOSAGE HAS BEEN CONFIRMED, SUGGEST FOLLOW UP TESTING OF ANTITHROMBIN III LEVELS.   Basic metabolic panel     Status: Abnormal   Collection Time: 07/15/14  3:09 AM  Result Value Ref Range   Sodium 136 135 - 145 mmol/L   Potassium 4.5 3.5 - 5.1 mmol/L   Chloride 101 96 - 112 mmol/L   CO2 24 19 - 32 mmol/L   Glucose, Bld 101 (H) 70 - 99 mg/dL   BUN 11 6 - 23 mg/dL   Creatinine, Ser 1.05 0.50 - 1.35 mg/dL   Calcium 8.8 8.4 - 10.5 mg/dL   GFR calc non Af Amer 78 (L) >90 mL/min   GFR calc Af Amer >90 >90 mL/min    Comment: (NOTE) The eGFR has been calculated using the CKD EPI equation. This calculation has not been validated in all clinical situations. eGFR's persistently <90 mL/min signify possible Chronic  Kidney Disease.    Anion gap 11 5 - 15  Heparin level (unfractionated)     Status: None   Collection Time: 07/15/14  4:04 PM  Result Value Ref Range   Heparin Unfractionated 0.30 0.30 - 0.70 IU/mL    Comment:        IF HEPARIN RESULTS ARE BELOW EXPECTED VALUES, AND PATIENT DOSAGE HAS BEEN CONFIRMED, SUGGEST FOLLOW UP TESTING OF ANTITHROMBIN III LEVELS.   Heparin level (unfractionated)     Status: Abnormal   Collection Time: 07/15/14 11:00 PM  Result Value Ref Range   Heparin Unfractionated 0.27 (L) 0.30 - 0.70 IU/mL    Comment:        IF HEPARIN RESULTS ARE BELOW EXPECTED VALUES, AND PATIENT DOSAGE HAS BEEN CONFIRMED, SUGGEST FOLLOW UP TESTING OF ANTITHROMBIN III LEVELS.   CBC     Status: Abnormal   Collection Time: 07/16/14  2:45 AM  Result Value Ref Range   WBC 8.1 4.0 - 10.5 K/uL   RBC 5.37 4.22 - 5.81 MIL/uL   Hemoglobin 13.8 13.0 - 17.0 g/dL   HCT 41.6 39.0 - 52.0 %   MCV 77.5 (L) 78.0 - 100.0 fL   MCH 25.7 (L) 26.0 - 34.0 pg   MCHC 33.2 30.0 - 36.0 g/dL   RDW 14.1 11.5 - 15.5 %   Platelets 31 (L) 150 - 400 K/uL    Comment: REPEATED TO VERIFY CONSISTENT WITH PREVIOUS RESULT DELTA CHECK NOTED SPECIMEN CHECKED FOR CLOTS   Heparin level (unfractionated)     Status: None   Collection Time: 07/16/14  2:45 AM  Result Value Ref Range   Heparin Unfractionated 0.38 0.30 - 0.70 IU/mL    Comment:        IF HEPARIN RESULTS ARE BELOW EXPECTED VALUES, AND PATIENT DOSAGE HAS BEEN CONFIRMED, SUGGEST FOLLOW UP TESTING OF ANTITHROMBIN III LEVELS.    Dg Chest 2 View  07/14/2014   CLINICAL DATA:  Sudden onset fever  EXAM: CHEST  2 VIEW  COMPARISON:  Chest CT 07/11/2014  FINDINGS: Lungs are hypoaerated with crowding of the bronchovascular markings. Heart size normal. No pleural effusion. No acute osseous abnormality.  IMPRESSION: Low volumes without focal acute finding.   Electronically Signed   By: Conchita Paris M.D.   On: 07/14/2014 21:04   Review of Systems  Constitutional: Negative.   HENT: Negative.   Eyes: Negative.   Cardiovascular: Negative.   Gastrointestinal: Positive for heartburn. Negative for nausea, vomiting, abdominal pain, diarrhea, constipation and blood in stool.  Genitourinary: Negative.   Musculoskeletal: Negative.   Skin: Negative.   Neurological: Negative.   Endo/Heme/Allergies: Negative.   Psychiatric/Behavioral: Negative.    Blood pressure 134/85, pulse 85, temperature 97.7 F (36.5 C), temperature source Oral, resp. rate 16, height 6' 4"  (1.93 m), weight 117.4 kg (258 lb 13.1 oz), SpO2 90 %. Physical Exam  Constitutional: He appears well-developed and well-nourished.  HENT:  Head: Normocephalic and atraumatic.  Eyes: Conjunctivae and EOM are normal. Pupils are equal, round, and reactive to light.  Neck: Normal range of motion. Neck supple.  Cardiovascular: Normal rate and regular rhythm.   GI: Soft. Bowel sounds are normal.  Musculoskeletal: Normal range of motion.  Skin: Skin is warm and dry.  Psychiatric: He has a normal mood and affect. His behavior is normal. Judgment and thought content normal.   Assessment/Plan: 1) Colorectal cancer screening/Family history of colon cancer: Patient does not want to have a colonoscopy at this time as he does not want to delay his stay in the hospital. He is worried about the cost of the hospitalization. I have discussed this with Dr. Marin Olp who has agreed to let the patient have his colonsocopy on an OP basis. He has been advised to call our office once he is ready to schedule a colonoscopy.  2) Recent PE/DVT; On Heparin.  3) HTN  4) Thrombocytopenia. 5) Fever/SOB has resolved.   Fredrico Beedle 07/16/2014, 4:12 PM

## 2014-07-17 ENCOUNTER — Inpatient Hospital Stay (HOSPITAL_COMMUNITY): Payer: 59

## 2014-07-17 LAB — CBC
HCT: 40.5 % (ref 39.0–52.0)
Hemoglobin: 13.6 g/dL (ref 13.0–17.0)
MCH: 26 pg (ref 26.0–34.0)
MCHC: 33.6 g/dL (ref 30.0–36.0)
MCV: 77.3 fL — ABNORMAL LOW (ref 78.0–100.0)
PLATELETS: 34 10*3/uL — AB (ref 150–400)
RBC: 5.24 MIL/uL (ref 4.22–5.81)
RDW: 14.1 % (ref 11.5–15.5)
WBC: 7.7 10*3/uL (ref 4.0–10.5)

## 2014-07-17 LAB — COMPREHENSIVE METABOLIC PANEL
ALT: 28 U/L (ref 0–53)
AST: 19 U/L (ref 0–37)
Albumin: 3.5 g/dL (ref 3.5–5.2)
Alkaline Phosphatase: 59 U/L (ref 39–117)
Anion gap: 11 (ref 5–15)
BUN: 9 mg/dL (ref 6–23)
CO2: 23 mmol/L (ref 19–32)
Calcium: 9.4 mg/dL (ref 8.4–10.5)
Chloride: 102 mmol/L (ref 96–112)
Creatinine, Ser: 0.91 mg/dL (ref 0.50–1.35)
GFR calc Af Amer: >90 mL/min (ref >90)
GFR calc non Af Amer: >90 mL/min (ref >90)
GLUCOSE: 94 mg/dL (ref 70–99)
POTASSIUM: 4.5 mmol/L (ref 3.5–5.1)
SODIUM: 136 mmol/L (ref 135–145)
Total Bilirubin: 0.7 mg/dL (ref 0.3–1.2)
Total Protein: 6.7 g/dL (ref 6.0–8.3)

## 2014-07-17 LAB — HEPARIN LEVEL (UNFRACTIONATED): Heparin Unfractionated: 0.51 IU/mL (ref 0.30–0.70)

## 2014-07-17 LAB — SAVE SMEAR

## 2014-07-17 MED ORDER — RIVAROXABAN 15 MG PO TABS
15.0000 mg | ORAL_TABLET | Freq: Two times a day (BID) | ORAL | Status: DC
  Administered 2014-07-17 – 2014-07-18 (×3): 15 mg via ORAL
  Filled 2014-07-17 (×7): qty 1

## 2014-07-17 NOTE — Progress Notes (Signed)
Pt transferred to room 2W29  from 3South. Pt is alert and oriented x4, able to follow commands, denies any pain at this time. VSS, no s/s of respiratory distress on room air. Standard hospital orientation completed, bed in lowest position, call bell is within reach. Will continue to monitor pt closely.

## 2014-07-17 NOTE — Progress Notes (Signed)
Mr. Zietz seems to be doing pretty well. He did not want to have the colonoscopy done as inpatient. He says that this can be done as now patient insurance will pay for it.  I noted that his platelet count has dropped quite a bit. When he was admitted, his platelet count was 75,000. I am not sure as to why his platelet count was on the low side. Currently, the platelet count is down to 31,000. He really needs to be checked for HIT. We need to get him off heparin. I will get him on Xarelto.   He's had no problems of bleeding. He was walking a little bit. He's had no increased problems with pain. 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  He's had no shortness of breath. He's had no cough. There's been no nausea or vomiting.  On his physical exam, his vital signs are all stable. His oxygen saturation is 94%. His blood pressure is 119/78. Pulse is 90. Lungs are clear. Cardiac exam regular rate and rhythm with no murmurs, rubs or bruits. Abdomen is soft. He has good bowel sounds. There is no palpable liver or spleen tip. Extremities shows some chronic pain in the right leg. Has some chronic nonpitting edema in the right leg. Skin exam shows no rashes.  Again, I am not clear as to why the platelet count is low. He is on heparin. When he came in the platelet count was low. I will probably need to look at a blood smear. I think we need to have a ultrasound of his abdomen and look for any splenomegaly.  It is possible that the low platelets could be part of a anti-cardiolipin antibody syndrome.  Again I would get him off heparin and get him on Xarelto.  The thrombocytopenia is certainly complicating the situation and needs to be addressed before he can be discharged.  Vida Roller 3:14

## 2014-07-17 NOTE — Progress Notes (Signed)
Patient to transfer to 2W29 report given to receiving nurse, all questions answered at this time.  Pt. VSS with no s/s of distress noted.  Patient stable at transfer.

## 2014-07-17 NOTE — Discharge Instructions (Signed)
Information on my medicine - XARELTO (rivaroxaban)  This medication education was reviewed with me or my healthcare representative as part of my discharge preparation.  The pharmacist that spoke with me during my hospital stay was:  Mindy Gali C, [place]? Xarelto was prescribed to treat blood clots that may have been found in the veins of your legs (deep vein thrombosis) or in your lungs (pulmonary embolism) and to reduce the risk of them occurring again.  What do you need to know about Xarelto? The starting dose is one 15 mg tablet taken TWICE daily with food for the FIRST 21 DAYS then on May 5th  the dose is changed to one 20 mg tablet taken ONCE A DAY with your evening meal.  DO NOT stop taking Xarelto without talking to the health care provider who prescribed the medication.  Refill your prescription for 20 mg tablets before you run out.  After discharge, you should have regular check-up appointments with your healthcare provider that is prescribing your Xarelto.  In the future your dose may need to be changed if your kidney function changes by a significant amount.  What do you do if you miss a dose? If you are taking Xarelto TWICE DAILY and you miss a dose, take it as soon as you remember. You may take two 15 mg tablets (total 30 mg) at the same time then resume your regularly scheduled 15 mg twice daily the next day.  If you are taking Xarelto ONCE DAILY and you miss a dose, take it as soon as you remember on the same day then continue your regularly scheduled once daily regimen the next day. Do not take two doses of Xarelto at the same time.   Important Safety Information Xarelto is a blood thinner medicine that can cause bleeding. You should call your healthcare provider right away if you experience any of the following: ? Bleeding from an injury or your nose that does not stop. ? Unusual colored urine (red or dark brown) or unusual  colored stools (red or black). ? Unusual bruising for unknown reasons. ? A serious fall or if you hit your head (even if there is no bleeding).  Some medicines may interact with Xarelto and might increase your risk of bleeding while on Xarelto. To help avoid this, consult your healthcare provider or pharmacist prior to using any new prescription or non-prescription medications, including herbals, vitamins, non-steroidal anti-inflammatory drugs (NSAIDs) and supplements.  This website has more information on Xarelto: [email or web address]

## 2014-07-17 NOTE — Progress Notes (Signed)
[facility] PROGRESS NOTE  SHINICHI ANGUIANO JZP:[id number] DOB: 1960/09/15 DOA: 07/11/2014 PCP: Reginia Naas, MD  Assessment/Plan:   DEEP VENOUS THROMBOPHLEBITIS, HX OF/ Lupus anticoagulant with hypercoagulable state/Pulmonary embolism - No plans for more hypercoagulable workup per hematologist on last note - No plans for IR procedure EKOS PE lysis. - doppler of lower extremities reports right lower extremity DVT - Given decrease in platelet count patient discontinued from heparin drip and will be placed on xarelto - Will reassess platelet count next a.m.  SOB/Fever - Resolved fever could have been secondary to atelectasis - Patient spiked fever, will obtain sputum culture, blood culture x 2, chest x ray reported low volumes without focal acute findings..   - Antibiotics discontinued and patient continues to do well with no spike in fevers  Active Problems:   Essential hypertension - Currently continuing to hold hypertensive medications in lieu of submassive PE    Atrial fibrillation - currently on heparin gtt - rate controlled off of rate control medication    Thrombocytopenia - hematology on board and assisting. - continue daily cbc's    Elevated troponin - Most likely 2ary to PE, no chest pain reported today  Code Status:full Family Communication: discussed with family at bedside.  Disposition Plan: Pending improvement in condition and recommendations of specialist involved   Consultants:  Radiology  Critical care  Hematology  GI  Procedures:  pending   HPI/Subjective: No new complaints today. Patient inquiring about discharge.  Objective: Filed Vitals:   07/17/14 1135  BP: 126/89  Pulse: 107  Temp:   Resp: 20    Intake/Output Summary (Last 24 hours) at 07/17/14 1529 Last data filed at 07/17/14 0800  Gross per 24 hour  Intake 678.53 ml  Output    550 ml  Net 128.53 ml   Filed Weights   07/11/14 1723 07/12/14 0100  Weight: 121.564  kg (268 lb) 117.4 kg (258 lb 13.1 oz)    Exam:   General:  Pt in nad, alert and awake  Cardiovascular: s1 and s2 present, no rubs  Respiratory: cta bl, no wheezes, equal chest rise  Abdomen: soft, Nt, nd  Musculoskeletal: no cyanosis or clubbing on limited exam.  Data Reviewed: Basic Metabolic Panel:  Recent Labs Lab 07/11/14 1605 07/15/14 0309 07/17/14 0800  NA 135 136 136  K 5.0 4.5 4.5  CL 103 101 102  CO2 23 24 23   GLUCOSE 116* 101* 94  BUN 16 11 9   CREATININE 0.96 1.05 0.91  CALCIUM 9.6 8.8 9.4   Liver Function Tests:  Recent Labs Lab 07/11/14 1605 07/17/14 0800  AST 32 19  ALT 26 28  ALKPHOS 69 59  BILITOT 0.8 0.7  PROT 7.8 6.7  ALBUMIN 4.6 3.5   No results for input(s): LIPASE, AMYLASE in the last 168 hours. No results for input(s): AMMONIA in the last 168 hours. CBC:  Recent Labs Lab 07/11/14 1605  07/13/14 0425 07/14/14 0311 07/15/14 0309 07/16/14 0245 07/17/14 0332  WBC 9.5  < > 9.4 8.8 9.7 8.1 7.7  NEUTROABS 6.8  --   --   --   --   --   --   HGB 14.3  < > 13.3 13.5 13.4 13.8 13.6  HCT 43.2  < > 40.0 40.1 40.6 41.6 40.5  MCV 78.1  < > 77.7* 78.8 78.4 77.5* 77.3*  PLT 75*  < > 56* 48* 33* 31* 34*  < > = values in this interval not displayed. Cardiac Enzymes:  Recent Labs Lab 07/11/14 1920  TROPONINI 0.16*   BNP (last 3 results)  Recent Labs  07/11/14 1605  BNP 194.5*    ProBNP (last 3 results) No results for input(s): PROBNP in the last 8760 hours.  CBG: No results for input(s): GLUCAP in the last 168 hours.  Recent Results (from the past 240 hour(s))  MRSA PCR Screening     Status: None   Collection Time: 07/12/14  1:39 AM  Result Value Ref Range Status   MRSA by PCR NEGATIVE NEGATIVE Final    Comment:        The GeneXpert MRSA Assay (FDA approved for NASAL specimens only), is one component of a comprehensive MRSA colonization surveillance program. It is not intended to diagnose MRSA infection nor to guide  or monitor treatment for MRSA infections.   Culture, blood (routine x 2)     Status: None (Preliminary result)   Collection Time: 07/14/14  6:03 PM  Result Value Ref Range Status   Specimen Description BLOOD RIGHT HAND  Final   Special Requests BOTTLES DRAWN AEROBIC ONLY [place]  Final   Culture   Final           BLOOD CULTURE RECEIVED NO GROWTH TO DATE CULTURE WILL BE HELD FOR 5 DAYS BEFORE ISSUING A FINAL NEGATIVE REPORT Performed at [facility]    Report Status PENDING  Incomplete  Culture, blood (routine x 2)     Status: None (Preliminary result)   Collection Time: 07/14/14  6:18 PM  Result Value Ref Range Status   Specimen Description BLOOD RIGHT HAND  Final   Special Requests BOTTLES DRAWN AEROBIC AND ANAEROBIC 5CC  Final   Culture   Final           BLOOD CULTURE RECEIVED NO GROWTH TO DATE CULTURE WILL BE HELD FOR 5 DAYS BEFORE ISSUING A FINAL NEGATIVE REPORT Performed at [facility]    Report Status PENDING  Incomplete     Studies: US Abdomen Complete  07/17/2014   CLINICAL DATA:  54 year old male with history of hypertension and lupus anticoagulant. Thrombocytopenia. Initial encounter.  EXAM: ULTRASOUND ABDOMEN COMPLETE  COMPARISON:  None.  FINDINGS: Gallbladder: No gallstones or wall thickening visualized. No sonographic Murphy sign noted.  Common bile duct: Diameter: 3.1 mil  Liver: Suggestion mild fatty infiltration without focal mass.  IVC: No abnormality visualized.  Pancreas: Evaluation limited by bowel gas.  Spleen: No evidence of splenomegaly. No focal splenic mass. Splenic length 9.5 cm.  Right Kidney: Length: 13.8 cm. Echogenicity within normal limits. No mass or hydronephrosis visualized.  Left Kidney: Length: 13.3 cm. Echogenicity within normal limits. No mass or hydronephrosis visualized.  Abdominal aorta: Limited by bowel gas. The portions visualized are unremarkable.  Other findings: None.  IMPRESSION: No evidence of splenomegaly.  Portions of the  abdominal aorta and majority of pancreas not well delineated secondary to bowel gas.  Mild fatty infiltration of the liver suspected.   Electronically Signed   By: Genia Del M.D.   On: 07/17/2014 09:11    Scheduled Meds: . fluticasone  1 spray Each Nare Daily  . OxyCODONE  40 mg Oral Q12H  . Rivaroxaban  15 mg Oral BID WC  . sodium chloride  3 mL Intravenous Q12H   Continuous Infusions:    Time spent: > 35 minutes   [name]  [facility] Pager [phone]. If 7PM-7AM, please contact night-coverage at [email or web address], password [facility] 07/17/2014, 3:29 PM  LOS: 6 days

## 2014-07-18 ENCOUNTER — Telehealth: Payer: Self-pay | Admitting: Hematology & Oncology

## 2014-07-18 ENCOUNTER — Other Ambulatory Visit: Payer: Self-pay | Admitting: Hematology & Oncology

## 2014-07-18 DIAGNOSIS — I2699 Other pulmonary embolism without acute cor pulmonale: Secondary | ICD-10-CM

## 2014-07-18 DIAGNOSIS — D6862 Lupus anticoagulant syndrome: Secondary | ICD-10-CM

## 2014-07-18 LAB — LACTATE DEHYDROGENASE: LDH: 204 U/L (ref 94–250)

## 2014-07-18 LAB — CBC
HEMATOCRIT: 40.9 % (ref 39.0–52.0)
Hemoglobin: 13.6 g/dL (ref 13.0–17.0)
MCH: 25.8 pg — ABNORMAL LOW (ref 26.0–34.0)
MCHC: 33.3 g/dL (ref 30.0–36.0)
MCV: 77.6 fL — AB (ref 78.0–100.0)
Platelets: 37 10*3/uL — ABNORMAL LOW (ref 150–400)
RBC: 5.27 MIL/uL (ref 4.22–5.81)
RDW: 14.1 % (ref 11.5–15.5)
WBC: 6.5 10*3/uL (ref 4.0–10.5)

## 2014-07-18 LAB — HEPARIN LEVEL (UNFRACTIONATED): Heparin Unfractionated: 2.01 IU/mL — ABNORMAL HIGH (ref 0.30–0.70)

## 2014-07-18 MED ORDER — RIVAROXABAN (XARELTO) VTE STARTER PACK (15 & 20 MG): ORAL_TABLET | ORAL | Status: DC

## 2014-07-18 MED ORDER — METOPROLOL TARTRATE 25 MG PO TABS: 25.0000 mg | ORAL_TABLET | Freq: Two times a day (BID) | ORAL | Status: AC

## 2014-07-18 NOTE — Progress Notes (Signed)
Lee Phillips is doing okay. He is now on 2 west.  I looked at his blood smear. I do not see anything that looked suspicious. His platelets looked well granulated. Platelets were somewhat large.  His ultrasound of the abdomen was pretty much unremarkable. There is no splenomegaly.  He is off heparin. He is on Xarelto.  There is no bleeding.  I have held his aspirin for right now.  His platelet count is stable at 37,000 area and his white cell count 6.5 and hemoglobin is 13.6. LDH is normal at 204. He has a normal BUN and creatinine.  His HIT studies are pending.  On physical exam, all his vital signs are stable. Oxygen saturation is 94%. Blood pressure 126/82. His lungs are clear. Cardiac exam regular rate and rhythm with no murmurs, rubs or bruits. Abdomen is soft. He has no palpable liver or spleen tip. Extremities shows the chronic mild nonpitting edema of the right leg. There is some tenderness in the calf of the right leg area skin exam shows no rashes, ecchymosis or petechia. Neurological exam is nonfocal.  He will wants to go home. I think he can go home. I would have him on the 15 mg twice a day of Xarelto. He'll be on this for 21 days and then on to 20 mg a day.  We must make sure that insurance will cover this for him. He just has very limited financial resources.  I told him that I would recommend he be out of work for one week. However, he really wants to go back to work because he is worried about losing his job since he just started it. I understand this and told him that he really cannot be doing any heavy work with lifting no more than 20-25 pounds. He has a compression stocking for his right leg at home. I told him to wear this when he works.  [name] 14:21

## 2014-07-18 NOTE — Telephone Encounter (Signed)
Katie RN will give pt 4-25 appointment times and contact information

## 2014-07-18 NOTE — Discharge Summary (Signed)
Physician Discharge Summary  Lee Phillips VVO:[id number] DOB: 09/26/1960 DOA: 07/11/2014  PCP: Reginia Naas, MD  Admit date: 07/11/2014 Discharge date: 07/18/2014  Time spent: > 35  minutes  Recommendations for Outpatient Follow-up:  1. Monitor platelet counts 2. Patient will need outpatient colonoscopy  Discharge Diagnoses:  Active Problems:   Essential hypertension   Atrial fibrillation   DEEP VENOUS THROMBOPHLEBITIS, HX OF   Lupus anticoagulant with hypercoagulable state   Pulmonary embolism   Thrombocytopenia   Elevated troponin   Fever presenting with conditions classified elsewhere   Discharge Condition: stable  Diet recommendation: Heart healthy  Filed Weights   07/11/14 1723 07/12/14 0100  Weight: 121.564 kg (268 lb) 117.4 kg (258 lb 13.1 oz)    History of present illness:  54 y.o. male   has a past medical history of Lupus anticoagulant with hypercoagulable state (05/16/2011); Hypertension; Irregular heart beat; A-fib; and Lymphedema.   Patient has history of remote DVT (2008) in a setting of Lupus anticoagulant in atrial fibrillation on chronic Coumadin. He is being followed by hematology. Last INR check was 6 months ago but not sure. Dr. Marin Olp follows his INR. Patient reports 2 week hx of shortness of breath with walking up the stairs   Upon further work up was diagnosed with new PE and RLE DVT despite being on therapeutic levels of INR on coumadin  Hospital Course:  DEEP VENOUS THROMBOPHLEBITIS, HX OF/ Lupus anticoagulant with hypercoagulable state/Pulmonary embolism - No plans for more hypercoagulable workup per hematologist on last note - No plans for IR procedure EKOS PE lysis. (was initial consideration) - doppler of lower extremities reports right lower extremity DVT - Will be discharged on Xarelto  SOB/Fever - Resolved fever could have been secondary to atelectasis - Work up negative for source of fever, Blood cx x 2 NGTD - Antibiotics  discontinued and patient continues to do well with no spike in fevers  Active Problems:  Essential hypertension - Currently continuing to hold hypertensive medications in lieu of submassive PE   Atrial fibrillation - on xarelto on d/c - continue lower dose B blocker for rate controlled after d/c   Thrombocytopenia - hematology on board and assisting. - Patient to f/u with hematologist after d/c for further evaluation and recommendations   Elevated troponin - Most likely 2ary to PE, no chest pain reported on day of discharge   Consultations:  Hematology  IR  Pulmonology  Discharge Exam: Filed Vitals:   07/18/14 0405  BP: 126/82  Pulse: 95  Temp: 98.7 F (37.1 C)  Resp: 19    General: Pt in nad, alert and awake Cardiovascular: s1 and s2 present, no rubs Respiratory: cta bl, no wheezes, equal chest rise  Discharge Instructions   Discharge Instructions    Call MD for:  redness, tenderness, or signs of infection (pain, swelling, redness, odor or green/yellow discharge around incision site)    Complete by:  As directed      Call MD for:  severe uncontrolled pain    Complete by:  As directed      Call MD for:  temperature >100.4    Complete by:  As directed      Diet - low sodium heart healthy    Complete by:  As directed      Discharge instructions    Complete by:  As directed   Please be sure to follow up with your primary care physician in 1-2 weeks or sooner should any new concerns arise.  Increase activity slowly    Complete by:  As directed           Current Discharge Medication List    START taking these medications   Details  Rivaroxaban (XARELTO STARTER PACK) 15 & 20 MG TBPK Take as directed on package: Start with one 15mg  tablet by mouth twice a day with food. On Day 22, switch to one 20mg  tablet once a day with food. Qty: 51 each, Refills: 0      CONTINUE these medications which have CHANGED   Details  metoprolol (LOPRESSOR) 25 MG  tablet Take 1 tablet (25 mg total) by mouth 2 (two) times daily. Qty: 60 tablet, Refills: 0      CONTINUE these medications which have NOT CHANGED   Details  fluticasone (FLONASE) 50 MCG/ACT nasal spray Place 1 spray into both nostrils daily.    HYDROcodone-acetaminophen (NORCO) 10-325 MG per tablet Take 10-325 mg by mouth Every 4 hours.    Multiple Vitamins-Minerals (MULTI-VITAMIN GUMMIES PO) Take 2 tablets by mouth daily.    OXYCONTIN 40 MG 12 hr tablet Take 40 mg by mouth Twice daily.      STOP taking these medications     lisinopril-hydrochlorothiazide (PRINZIDE,ZESTORETIC) 20-12.5 MG per tablet      triamcinolone (NASACORT) 55 MCG/ACT AERO nasal inhaler      warfarin (COUMADIN) 10 MG tablet        No Known Allergies    The results of significant diagnostics from this hospitalization (including imaging, microbiology, ancillary and laboratory) are listed below for reference.    Significant Diagnostic Studies: Dg Chest 2 View  07/14/2014   CLINICAL DATA:  Sudden onset fever  EXAM: CHEST  2 VIEW  COMPARISON:  Chest CT 07/11/2014  FINDINGS: Lungs are hypoaerated with crowding of the bronchovascular markings. Heart size normal. No pleural effusion. No acute osseous abnormality.  IMPRESSION: Low volumes without focal acute finding.   Electronically Signed   By: Conchita Paris M.D.   On: 07/14/2014 21:04   Dg Chest 2 View  07/10/2014   CLINICAL DATA:  Shortness of breath with exertion for 3 days, history hypertension, atrial fibrillation, former smoker, hypercoagulable with lupus anticoagulant  EXAM: CHEST  2 VIEW  COMPARISON:  01/11/2005  FINDINGS: Upper normal heart size.  Normal mediastinal contours and pulmonary vascularity.  Minimal chronic peribronchial thickening.  No acute infiltrate, pleural effusion or pneumothorax.  Bones unremarkable.  IMPRESSION: Minimal chronic bronchitic changes without infiltrate.   Electronically Signed   By: Lavonia Dana M.D.   On: 07/10/2014 18:32    Ct Angio Chest Pe W/cm &/or Wo Cm  07/11/2014   CLINICAL DATA:  Two day history of shortness of breath  EXAM: CT ANGIOGRAPHY CHEST WITH CONTRAST  TECHNIQUE: Multidetector CT imaging of the chest was performed using the standard protocol during bolus administration of intravenous contrast. Multiplanar CT image reconstructions and MIPs were obtained to evaluate the vascular anatomy.  CONTRAST:  156mL OMNIPAQUE IOHEXOL 350 MG/ML SOLN  COMPARISON:  Chest radiograph Aug 09, 2014  FINDINGS: There is extensive pulmonary embolus bilaterally arising from both main pulmonary arteries and extending into multiple upper and lower lobe pulmonary artery branches bilaterally. There is right heart strain with right ventricle to left ventricle ratio of 1.42, normal less than 0.9.  There is no appreciable thoracic aortic aneurysm or dissection.  There is patchy atelectasis in both lower lobes. There is a wedge-shaped defect in the posterior right base, suspicious for focal infarct. There is mild  patchy infiltrate in the superior segment of the right lower lobe, possibly a second infarct.  There is no appreciable thoracic adenopathy. The pericardium is not thickened. Visualized thyroid appears normal.  In the visualized upper abdomen, no lesions are identified.  There are no blastic or lytic bone lesions.  Review of the MIP images confirms the above findings.  IMPRESSION: Extensive central pulmonary emboli. Positive for acute PE with CT evidence of right heart strain (RV/LV Ratio = 1.42) consistent with at least submassive (intermediate risk) PE. The presence of right heart strain has been associated with an increased risk of morbidity and mortality. Please activate Code PE by paging [phone].  Probable infarct posterior right base. Question second infarct superior segment right lower lobe. Areas of patchy atelectatic change bilaterally.  No demonstrable adenopathy.  Critical Value/emergent results were called by telephone at  the time of interpretation on 07/11/2014 at 5:00 pm to [facility] , who verbally acknowledged these results.   Electronically Signed   By: Lowella Grip III M.D.   On: 07/11/2014 17:00   US Abdomen Complete  07/17/2014   CLINICAL DATA:  54 year old male with history of hypertension and lupus anticoagulant. Thrombocytopenia. Initial encounter.  EXAM: ULTRASOUND ABDOMEN COMPLETE  COMPARISON:  None.  FINDINGS: Gallbladder: No gallstones or wall thickening visualized. No sonographic Murphy sign noted.  Common bile duct: Diameter: 3.1 mil  Liver: Suggestion mild fatty infiltration without focal mass.  IVC: No abnormality visualized.  Pancreas: Evaluation limited by bowel gas.  Spleen: No evidence of splenomegaly. No focal splenic mass. Splenic length 9.5 cm.  Right Kidney: Length: 13.8 cm. Echogenicity within normal limits. No mass or hydronephrosis visualized.  Left Kidney: Length: 13.3 cm. Echogenicity within normal limits. No mass or hydronephrosis visualized.  Abdominal aorta: Limited by bowel gas. The portions visualized are unremarkable.  Other findings: None.  IMPRESSION: No evidence of splenomegaly.  Portions of the abdominal aorta and majority of pancreas not well delineated secondary to bowel gas.  Mild fatty infiltration of the liver suspected.   Electronically Signed   By: Genia Del M.D.   On: 07/17/2014 09:11    Microbiology: Recent Results (from the past 240 hour(s))  MRSA PCR Screening     Status: None   Collection Time: 07/12/14  1:39 AM  Result Value Ref Range Status   MRSA by PCR NEGATIVE NEGATIVE Final    Comment:        The GeneXpert MRSA Assay (FDA approved for NASAL specimens only), is one component of a comprehensive MRSA colonization surveillance program. It is not intended to diagnose MRSA infection nor to guide or monitor treatment for MRSA infections.   Culture, blood (routine x 2)     Status: None (Preliminary result)   Collection Time: 07/14/14  6:03 PM   Result Value Ref Range Status   Specimen Description BLOOD RIGHT HAND  Final   Special Requests BOTTLES DRAWN AEROBIC ONLY [place]  Final   Culture   Final           BLOOD CULTURE RECEIVED NO GROWTH TO DATE CULTURE WILL BE HELD FOR 5 DAYS BEFORE ISSUING A FINAL NEGATIVE REPORT Performed at [facility]    Report Status PENDING  Incomplete  Culture, blood (routine x 2)     Status: None (Preliminary result)   Collection Time: 07/14/14  6:18 PM  Result Value Ref Range Status   Specimen Description BLOOD RIGHT HAND  Final   Special Requests BOTTLES DRAWN AEROBIC AND  ANAEROBIC 5CC  Final   Culture   Final           BLOOD CULTURE RECEIVED NO GROWTH TO DATE CULTURE WILL BE HELD FOR 5 DAYS BEFORE ISSUING A FINAL NEGATIVE REPORT Performed at [facility]    Report Status PENDING  Incomplete     Labs: Basic Metabolic Panel:  Recent Labs Lab 07/11/14 1605 07/15/14 0309 07/17/14 0800  NA 135 136 136  K 5.0 4.5 4.5  CL 103 101 102  CO2 23 24 23   GLUCOSE 116* 101* 94  BUN 16 11 9   CREATININE 0.96 1.05 0.91  CALCIUM 9.6 8.8 9.4   Liver Function Tests:  Recent Labs Lab 07/11/14 1605 07/17/14 0800  AST 32 19  ALT 26 28  ALKPHOS 69 59  BILITOT 0.8 0.7  PROT 7.8 6.7  ALBUMIN 4.6 3.5   No results for input(s): LIPASE, AMYLASE in the last 168 hours. No results for input(s): AMMONIA in the last 168 hours. CBC:  Recent Labs Lab 07/11/14 1605  07/14/14 0311 07/15/14 0309 07/16/14 0245 07/17/14 0332 07/18/14 0400  WBC 9.5  < > 8.8 9.7 8.1 7.7 6.5  NEUTROABS 6.8  --   --   --   --   --   --   HGB 14.3  < > 13.5 13.4 13.8 13.6 13.6  HCT 43.2  < > 40.1 40.6 41.6 40.5 40.9  MCV 78.1  < > 78.8 78.4 77.5* 77.3* 77.6*  PLT 75*  < > 48* 33* 31* 34* 37*  < > = values in this interval not displayed. Cardiac Enzymes:  Recent Labs Lab 07/11/14 1920  TROPONINI 0.16*   BNP: BNP (last 3 results)  Recent Labs  07/11/14 1605  BNP 194.5*    ProBNP (last 3  results) No results for input(s): PROBNP in the last 8760 hours.  CBG: No results for input(s): GLUCAP in the last 168 hours.     Signed:  [name]  [facility] 07/18/2014, 12:39 PM

## 2014-07-18 NOTE — Progress Notes (Signed)
Pt given D/C paperwork and explained instructions and medications. Pt and wife with no questions or concerns. IV removed and volunteer service wheeled pt out.

## 2014-07-19 LAB — CARDIOLIPIN ANTIBODIES, IGG, IGM, IGA
Anticardiolipin IgG: 18 GPL U/mL — ABNORMAL HIGH (ref 0–14)
Anticardiolipin IgM: 10 MPL U/mL (ref 0–12)

## 2014-07-21 ENCOUNTER — Encounter: Payer: Self-pay | Admitting: *Deleted

## 2014-07-21 LAB — DRVVT MIX: dRVVT Mix: 65.3 s — ABNORMAL HIGH (ref 0.0–45.4)

## 2014-07-21 LAB — CULTURE, BLOOD (ROUTINE X 2)
Culture: NO GROWTH
Culture: NO GROWTH

## 2014-07-21 LAB — HEXAGONAL PHASE PHOSPHOLIPID: HEXAGONAL PHASE PHOSPHOLIPID: 46.5 s — AB (ref 0.0–8.0)

## 2014-07-21 LAB — LUPUS ANTICOAGULANT PANEL
DRVVT: 90.8 s — ABNORMAL HIGH (ref 0.0–55.1)
PTT Lupus Anticoagulant: 87.1 s — ABNORMAL HIGH (ref 0.0–50.0)

## 2014-07-21 LAB — DRVVT CONFIRM: dRVVT Confirm: 2.1 ratio — ABNORMAL HIGH (ref 0.0–1.4)

## 2014-07-21 LAB — PTT-LA MIX: PTT-LA Mix: 85.2 s — ABNORMAL HIGH (ref 0.0–50.0)

## 2014-07-22 LAB — SEROTONIN RELEASE ASSAY (SRA)
SRA .2 IU/mL UFH Ser-aCnc: <1 % (ref 0–20)
SRA 100IU/mL UFH Ser-aCnc: <1 % (ref 0–20)

## 2014-07-22 LAB — HEPARIN INDUCED THROMBOCYTOPENIA PNL: HEPARIN INDUCED PLT AB: 0.585 {OD_unit} — AB (ref 0.000–0.400)

## 2014-07-25 ENCOUNTER — Telehealth: Payer: Self-pay | Admitting: *Deleted

## 2014-07-25 NOTE — Telephone Encounter (Signed)
Patient is not feeling well and states his BP is up - 160/100. He was recently discharged from the hospital where his blood pressure medication, lisinopril, was discontinued. Spoke to Dr Marin Olp who wants patient to restart his lisinopril and contact his PCP for further blood pressure management. Patient aware and agreeable to plan.

## 2014-07-28 ENCOUNTER — Encounter: Payer: Self-pay | Admitting: Hematology & Oncology

## 2014-07-28 ENCOUNTER — Other Ambulatory Visit: Payer: Self-pay

## 2014-07-28 ENCOUNTER — Other Ambulatory Visit (HOSPITAL_BASED_OUTPATIENT_CLINIC_OR_DEPARTMENT_OTHER): Payer: 59

## 2014-07-28 ENCOUNTER — Ambulatory Visit (HOSPITAL_BASED_OUTPATIENT_CLINIC_OR_DEPARTMENT_OTHER): Payer: 59 | Admitting: Hematology & Oncology

## 2014-07-28 VITALS — BP 119/73 | HR 81 | Temp 97.9°F | Resp 20 | Ht 74.0 in | Wt 263.0 lb

## 2014-07-28 DIAGNOSIS — D6862 Lupus anticoagulant syndrome: Secondary | ICD-10-CM

## 2014-07-28 DIAGNOSIS — I2699 Other pulmonary embolism without acute cor pulmonale: Secondary | ICD-10-CM

## 2014-07-28 DIAGNOSIS — Z7901 Long term (current) use of anticoagulants: Secondary | ICD-10-CM

## 2014-07-28 LAB — CBC WITH DIFFERENTIAL (CANCER CENTER ONLY)
BASO#: 0 10*3/uL (ref 0.0–0.2)
BASO%: 0.3 % (ref 0.0–2.0)
EOS%: 4.1 % (ref 0.0–7.0)
Eosinophils Absolute: 0.3 10*3/uL (ref 0.0–0.5)
HCT: 40.9 % (ref 38.7–49.9)
HGB: 13.5 g/dL (ref 13.0–17.1)
LYMPH#: 1.4 10*3/uL (ref 0.9–3.3)
LYMPH%: 19.8 % (ref 14.0–48.0)
MCH: 26 pg — AB (ref 28.0–33.4)
MCHC: 33 g/dL (ref 32.0–35.9)
MCV: 79 fL — ABNORMAL LOW (ref 82–98)
MONO#: 0.6 10*3/uL (ref 0.1–0.9)
MONO%: 8.2 % (ref 0.0–13.0)
NEUT#: 4.9 10*3/uL (ref 1.5–6.5)
NEUT%: 67.6 % (ref 40.0–80.0)
Platelets: 152 10*3/uL (ref 145–400)
RBC: 5.2 10*6/uL (ref 4.20–5.70)
RDW: 14.1 % (ref 11.1–15.7)
WBC: 7.3 10*3/uL (ref 4.0–10.0)

## 2014-07-28 LAB — CMP (CANCER CENTER ONLY)
ALBUMIN: 4 g/dL (ref 3.3–5.5)
ALT: 36 U/L (ref 10–47)
AST: 28 U/L (ref 11–38)
Alkaline Phosphatase: 67 U/L (ref 26–84)
BILIRUBIN TOTAL: 1 mg/dL (ref 0.20–1.60)
BUN: 13 mg/dL (ref 7–22)
CO2: 27 meq/L (ref 18–33)
Calcium: 9.6 mg/dL (ref 8.0–10.3)
Chloride: 101 mEq/L (ref 98–108)
Creat: 0.9 mg/dl (ref 0.6–1.2)
GLUCOSE: 112 mg/dL (ref 73–118)
Potassium: 3.9 mEq/L (ref 3.3–4.7)
Sodium: 136 mEq/L (ref 128–145)
Total Protein: 7.6 g/dL (ref 6.4–8.1)

## 2014-07-28 MED ORDER — RIVAROXABAN 20 MG PO TABS: 20.0000 mg | ORAL_TABLET | Freq: Every day | ORAL | Status: DC

## 2014-07-28 NOTE — Progress Notes (Signed)
Hematology and Oncology Follow Up Visit  Lee Phillips [id number] 24-Mar-1961 54 y.o. 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 Unfortunately, he was admitted to the hospital back in early April. He had a large pulmonary embolism. He had a new large thrombus in the right leg.  He was on Coumadin. He was therapeutic.  He was placed on heparin. He was kept on heparin until discharge then placed on Xarelto.  He had thrombocytopenia during the hospitalization. Even though his heparin-induced thrombocytopenia assay came back negative, I have to believe that he is allergic to heparin. As such, we will make sure the heparin is an allergy for him.  His right leg feels better. He's working. He does not complain of any type of shortness of breath or chest wall pain. If he is on Norco which helps.  There is no bleeding. Overall, he is tolerating the Xarelto well. .  Medications:  Current outpatient prescriptions:  .  fluticasone (FLONASE) 50 MCG/ACT nasal spray, Place 1 spray into both nostrils daily., Disp: , Rfl:  .  HYDROcodone-acetaminophen (NORCO) 10-325 MG per tablet, Take 10-325 mg by mouth Every 4 hours., Disp: , Rfl:  .  lisinopril-hydrochlorothiazide (PRINZIDE,ZESTORETIC) 20-12.5 MG per tablet, Take 1 tablet by mouth daily., Disp: , Rfl:  .  LORazepam (ATIVAN) 0.5 MG tablet, Take 0.5 mg by mouth at bedtime., Disp: , Rfl:  .  metoprolol (LOPRESSOR) 25 MG tablet, Take 1 tablet (25 mg total) by mouth 2 (two) times daily., Disp: 60 tablet, Rfl: 0 .  Multiple Vitamins-Minerals (MULTI-VITAMIN GUMMIES PO), Take 2 tablets by mouth daily., Disp: , Rfl:  .  OXYCONTIN 40 MG 12 hr tablet, Take 40 mg by mouth Twice  daily., Disp: , Rfl:  .  rivaroxaban (XARELTO) 20 MG TABS tablet, Take 1 tablet (20 mg total) by mouth daily with supper., Disp: 30 tablet, Rfl: 12  Allergies: No Known Allergies  Past Medical History, Surgical history, Social history, and Family History were reviewed and updated.  Review of Systems: As above  Physical Exam:  height is 6\' 2"  (1.88 m) and weight is 263 lb (119.296 kg). His oral temperature is 97.9 F (36.6 C). His blood pressure is 119/73 and his pulse is 81. His respiration is 20.   Well-developed and well-nourished white woman. Lungs are clear. No rales, wheezes or rhonchi are noted. Cardiac exam regular rate and rhythm with no murmurs, rubs or bruits. Abdomen is soft. Has good bowel sounds. There is no fluid wave. There is no palpable liver or spleen tip. Abdomen is soft. Has good bowel sounds. There are no palpable liver or spleen tip. Back exam shows no tenderness over the spine ribs or hips. Extremities shows some mild nonpitting edema of the left leg. No venous cord is noted. He has some stasis dermatitis changes in the left leg. Right leg is with a compression stocking. There is some tenderness to palpation over the right leg. Skin exam shows no rashes outside of the changes in the left leg.  Lab Results  Component Value Date   WBC 7.3 07/28/2014   HGB 13.5 07/28/2014   HCT 40.9 07/28/2014   MCV 79* 07/28/2014   PLT 152 Platelet count consistent in citrate 07/28/2014  Chemistry      Component Value Date/Time   NA 136 07/17/2014 0800      Component Value Date/Time   CALCIUM 9.4 07/17/2014 0800         Impression and Plan: Lee Phillips is 54 year old gentleman. This recent thromboembolic event clearly indicates a hypercoagulable state for him. He does have a positive lupus anticoagulant and elevated anti- cardiolipin antibodies.  He needs lifelong anticoagulation.  I don't see that we have put him through any scans right now. This really is not going  to change our management of him.  I want to see him back in 2 months. Follow skin 2 months, maybe we can spread his appointments out a bit more.  Again, I do believe that he does have a positive HITT assay. As such, I will not exposing any heparin in the future.  I spent about 25-30 minutes with him.       Volanda Napoleon, MD 4/25/20169:38 AM

## 2014-08-04 ENCOUNTER — Encounter: Payer: Self-pay | Admitting: *Deleted

## 2014-09-29 ENCOUNTER — Other Ambulatory Visit: Payer: Self-pay | Admitting: *Deleted

## 2014-09-29 ENCOUNTER — Other Ambulatory Visit (HOSPITAL_BASED_OUTPATIENT_CLINIC_OR_DEPARTMENT_OTHER): Payer: 59

## 2014-09-29 ENCOUNTER — Ambulatory Visit (HOSPITAL_BASED_OUTPATIENT_CLINIC_OR_DEPARTMENT_OTHER): Payer: 59 | Admitting: Hematology & Oncology

## 2014-09-29 ENCOUNTER — Encounter: Payer: Self-pay | Admitting: Hematology & Oncology

## 2014-09-29 VITALS — BP 130/72 | HR 63 | Temp 98.1°F | Resp 18 | Ht 74.0 in | Wt 265.0 lb

## 2014-09-29 DIAGNOSIS — D6862 Lupus anticoagulant syndrome: Secondary | ICD-10-CM

## 2014-09-29 LAB — CMP (CANCER CENTER ONLY)
ALT(SGPT): 29 U/L (ref 10–47)
AST: 27 U/L (ref 11–38)
Albumin: 4.1 g/dL (ref 3.3–5.5)
Alkaline Phosphatase: 67 U/L (ref 26–84)
BILIRUBIN TOTAL: 1.1 mg/dL (ref 0.20–1.60)
BUN, Bld: 12 mg/dL (ref 7–22)
CO2: 27 mEq/L (ref 18–33)
Calcium: 9.2 mg/dL (ref 8.0–10.3)
Chloride: 99 mEq/L (ref 98–108)
Creat: 1.2 mg/dl (ref 0.6–1.2)
GLUCOSE: 100 mg/dL (ref 73–118)
Potassium: 4.1 mEq/L (ref 3.3–4.7)
Sodium: 137 mEq/L (ref 128–145)
Total Protein: 7.2 g/dL (ref 6.4–8.1)

## 2014-09-29 LAB — CBC WITH DIFFERENTIAL (CANCER CENTER ONLY)
BASO#: 0 10*3/uL (ref 0.0–0.2)
BASO%: 0.2 % (ref 0.0–2.0)
EOS%: 3.1 % (ref 0.0–7.0)
Eosinophils Absolute: 0.2 10*3/uL (ref 0.0–0.5)
HCT: 40 % (ref 38.7–49.9)
HGB: 13.8 g/dL (ref 13.0–17.1)
LYMPH#: 1.8 10*3/uL (ref 0.9–3.3)
LYMPH%: 28.5 % (ref 14.0–48.0)
MCH: 26.6 pg — AB (ref 28.0–33.4)
MCHC: 34.5 g/dL (ref 32.0–35.9)
MCV: 77 fL — AB (ref 82–98)
MONO#: 0.6 10*3/uL (ref 0.1–0.9)
MONO%: 9.2 % (ref 0.0–13.0)
NEUT#: 3.6 10*3/uL (ref 1.5–6.5)
NEUT%: 59 % (ref 40.0–80.0)
Platelets: 161 10*3/uL (ref 145–400)
RBC: 5.18 10*6/uL (ref 4.20–5.70)
RDW: 14.7 % (ref 11.1–15.7)
WBC: 6.2 10*3/uL (ref 4.0–10.0)

## 2014-09-29 LAB — D-DIMER, QUANTITATIVE (NOT AT ARMC): D DIMER QUANT: 0.35 ug{FEU}/mL (ref 0.00–0.48)

## 2014-09-29 MED ORDER — RIVAROXABAN 20 MG PO TABS: 20.0000 mg | ORAL_TABLET | Freq: Every day | ORAL | Status: DC

## 2014-09-29 NOTE — Progress Notes (Signed)
Hematology and Oncology Follow Up Visit  Lee Phillips [id number] June 07, 1960 54 y.o. 09/29/2014   Principle Diagnosis:  New thrombolic disease of the right leg/pulmonary embolism History of right lower extremity DVT with postphlebitic syndrome Lupus anticoagulant positive Heparin-induced thrombocytopenia  Current Therapy:   Xarelto 20 g by mouth daily      Interim History:  Lee Phillips is back for for follow-up. He sees a don't pretty well. He's working. He is enjoying this. He works for the city of [place].  He has quite a bit of pain in the right leg. He has a compression stocking for his right leg. He is on pain medication. The pain medication allows him to work at least be functional.  He's had no problem with bleeding.  His appetite is doing okay. He's had no nausea or vomiting. He's had no fever. He's had no cough or shortness of breath. He's had no chest wall pain.  Overall, his performance status is ECOG 1.   Medications:  Current outpatient prescriptions:  .  fluticasone (FLONASE) 50 MCG/ACT nasal spray, Place 1 spray into both nostrils daily., Disp: , Rfl:  .  HYDROcodone-acetaminophen (NORCO) 10-325 MG per tablet, Take 10-325 mg by mouth Every 4 hours., Disp: , Rfl:  .  lisinopril-hydrochlorothiazide (PRINZIDE,ZESTORETIC) 20-12.5 MG per tablet, Take 1 tablet by mouth daily., Disp: , Rfl:  .  LORazepam (ATIVAN) 0.5 MG tablet, Take 0.5 mg by mouth at bedtime., Disp: , Rfl:  .  metoprolol (LOPRESSOR) 25 MG tablet, Take 1 tablet (25 mg total) by mouth 2 (two) times daily., Disp: 60 tablet, Rfl: 0 .  Multiple Vitamins-Minerals (MULTI-VITAMIN GUMMIES PO), Take 2 tablets by mouth daily., Disp: , Rfl:  .  OXYCONTIN 40 MG 12 hr tablet, Take 40 mg by mouth Twice daily., Disp: , Rfl:  .  rivaroxaban (XARELTO) 20 MG TABS tablet, Take 1 tablet (20 mg total) by mouth daily with supper., Disp: 30 tablet, Rfl: 12  Allergies:  Allergies  Allergen Reactions  . Heparin Rash     Past Medical History, Surgical history, Social history, and Family History were reviewed and updated.  Review of Systems: As above  Physical Exam:  height is 6\' 2"  (1.88 m) and weight is 265 lb (120.203 kg). His oral temperature is 98.1 F (36.7 C). His blood pressure is 130/72 and his pulse is 63. His respiration is 18.   Well-developed and well-nourished white woman. Lungs are clear. No rales, wheezes or rhonchi are noted. Cardiac exam regular rate and rhythm with no murmurs, rubs or bruits. Abdomen is soft. Has good bowel sounds. There is no fluid wave. There is no palpable liver or spleen tip. Abdomen is soft. Has good bowel sounds. There are no palpable liver or spleen tip. Back exam shows no tenderness over the spine ribs or hips. Extremities shows some mild nonpitting edema of the left leg. No venous cord is noted. He has some stasis dermatitis changes in the left leg. Right leg is with a compression stocking. There is some tenderness to palpation over the right leg. Skin exam shows no rashes outside of the changes in the left leg.  Lab Results  Component Value Date   WBC 6.2 09/29/2014   HGB 13.8 09/29/2014   HCT 40.0 09/29/2014   MCV 77* 09/29/2014   PLT 161 09/29/2014     Chemistry      Component Value Date/Time   NA 137 09/29/2014 0830   NA 136 07/17/2014 0800  Component Value Date/Time   CALCIUM 9.2 09/29/2014 0830   CALCIUM 9.4 07/17/2014 0800         Impression and Plan: Lee Phillips is 54 year old gentleman. This recent thromboembolic event clearly indicates a hypercoagulable state for him. He does have a positive lupus anticoagulant and elevated anti- cardiolipin antibodies.  He needs lifelong anticoagulation.  I don't see that we have put him through any scans right now. This really is not going to change our management of him.  I want to see him back in 3 months  Again, I do believe that he does have a positive HITT assay. As such, I will not  exposing any heparin in the future.  I spent about 25-30 minutes with him.       Volanda Napoleon, MD 6/27/201610:47 AM

## 2015-01-14 ENCOUNTER — Telehealth: Payer: Self-pay | Admitting: Hematology & Oncology

## 2015-01-14 NOTE — Telephone Encounter (Signed)
Md will be out of office on 01/29/15.  Apt was cx and resch for 02/24/15.  Patient was called, there was no answer, so i left a detailed message and asked patient to call the office back to confirm appt.  appt calendar was mailed to patient's home also

## 2015-01-29 ENCOUNTER — Ambulatory Visit: Payer: 59 | Admitting: Hematology & Oncology

## 2015-01-29 ENCOUNTER — Other Ambulatory Visit: Payer: 59

## 2015-02-24 ENCOUNTER — Other Ambulatory Visit: Payer: 59

## 2015-02-24 ENCOUNTER — Ambulatory Visit: Payer: 59 | Admitting: Hematology & Oncology

## 2015-02-27 ENCOUNTER — Other Ambulatory Visit (HOSPITAL_BASED_OUTPATIENT_CLINIC_OR_DEPARTMENT_OTHER): Payer: 59

## 2015-02-27 ENCOUNTER — Ambulatory Visit (HOSPITAL_BASED_OUTPATIENT_CLINIC_OR_DEPARTMENT_OTHER): Payer: 59 | Admitting: Hematology & Oncology

## 2015-02-27 ENCOUNTER — Encounter: Payer: Self-pay | Admitting: Hematology & Oncology

## 2015-02-27 VITALS — BP 127/76 | HR 70 | Temp 98.0°F | Resp 16 | Ht 74.0 in | Wt 263.0 lb

## 2015-02-27 DIAGNOSIS — D6862 Lupus anticoagulant syndrome: Secondary | ICD-10-CM

## 2015-02-27 LAB — CBC WITH DIFFERENTIAL (CANCER CENTER ONLY)
BASO#: 0 10*3/uL (ref 0.0–0.2)
BASO%: 0.1 % (ref 0.0–2.0)
EOS%: 2.6 % (ref 0.0–7.0)
Eosinophils Absolute: 0.2 10*3/uL (ref 0.0–0.5)
HEMATOCRIT: 42.5 % (ref 38.7–49.9)
HEMOGLOBIN: 14.6 g/dL (ref 13.0–17.1)
LYMPH#: 1.9 10*3/uL (ref 0.9–3.3)
LYMPH%: 27.6 % (ref 14.0–48.0)
MCH: 27.3 pg — AB (ref 28.0–33.4)
MCHC: 34.4 g/dL (ref 32.0–35.9)
MCV: 79 fL — AB (ref 82–98)
MONO#: 0.7 10*3/uL (ref 0.1–0.9)
MONO%: 9.9 % (ref 0.0–13.0)
NEUT%: 59.8 % (ref 40.0–80.0)
NEUTROS ABS: 4.2 10*3/uL (ref 1.5–6.5)
Platelets: 181 10*3/uL (ref 145–400)
RBC: 5.35 10*6/uL (ref 4.20–5.70)
RDW: 13 % (ref 11.1–15.7)
WBC: 7 10*3/uL (ref 4.0–10.0)

## 2015-02-27 LAB — COMPREHENSIVE METABOLIC PANEL (CC13)
ALBUMIN: 4.3 g/dL (ref 3.5–5.0)
ALK PHOS: 73 U/L (ref 40–150)
ALT: 26 U/L (ref 0–55)
AST: 23 U/L (ref 5–34)
Anion Gap: 10 mEq/L (ref 3–11)
BILIRUBIN TOTAL: 0.72 mg/dL (ref 0.20–1.20)
BUN: 14.2 mg/dL (ref 7.0–26.0)
CALCIUM: 10.2 mg/dL (ref 8.4–10.4)
CO2: 25 mEq/L (ref 22–29)
Chloride: 104 mEq/L (ref 98–109)
Creatinine: 1 mg/dL (ref 0.7–1.3)
EGFR: 89 mL/min/{1.73_m2} — AB (ref >90)
Glucose: 88 mg/dl (ref 70–140)
Potassium: 4.1 mEq/L (ref 3.5–5.1)
Sodium: 140 mEq/L (ref 136–145)
Total Protein: 7.4 g/dL (ref 6.4–8.3)

## 2015-02-27 NOTE — Progress Notes (Signed)
Hematology and Oncology Follow Up Visit  Lee Phillips [id number] 10-13-1960 54 y.o. 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 He had a good Thanksgiving. He is off work today.  He is doing well with the Xarelto. He had a another pulmonary embolism/DVT back in April. He is on lifelong anticoagulation.  He's had no problem with bleeding. He's had no change in bowel or bladder habits. He's had no cough. There's been no chest wall pain.  Has chronic pain in the right leg from post phlebitic syndrome.  He's had no fever. He's had no rashes.   Overall, his performance status is ECOG 1.   Medications:  Current outpatient prescriptions:  .  fluticasone (FLONASE) 50 MCG/ACT nasal spray, Place 1 spray into both nostrils daily., Disp: , Rfl:  .  HYDROcodone-acetaminophen (NORCO) 10-325 MG per tablet, Take 10-325 mg by mouth Every 4 hours., Disp: , Rfl:  .  lisinopril-hydrochlorothiazide (PRINZIDE,ZESTORETIC) 20-12.5 MG per tablet, Take 1 tablet by mouth daily., Disp: , Rfl:  .  metoprolol (LOPRESSOR) 25 MG tablet, Take 1 tablet (25 mg total) by mouth 2 (two) times daily., Disp: 60 tablet, Rfl: 0 .  Multiple Vitamins-Minerals (MULTI-VITAMIN GUMMIES PO), Take 2 tablets by mouth daily., Disp: , Rfl:  .  OXYCONTIN 40 MG 12 hr tablet, Take 40 mg by mouth Twice daily., Disp: , Rfl:  .  rivaroxaban (XARELTO) 20 MG TABS tablet, Take 1 tablet (20 mg total) by mouth daily with supper., Disp: 30 tablet, Rfl: 12  Allergies:  Allergies  Allergen Reactions  . Heparin Rash    Past Medical History, Surgical history, Social history, and Family History were reviewed and updated.  Review of Systems: As  above  Physical Exam:  height is 6\' 2"  (1.88 m) and weight is 263 lb (119.296 kg). His oral temperature is 98 F (36.7 C). His blood pressure is 127/76 and his pulse is 70. His respiration is 16.   Well-developed and well-nourished white male. His head and neck exam shows no ocular or oral lesions. He has no scleral icterus. He has no palpable lymph nodes in the neck. Lungs are clear. No rales, wheezes or rhonchi are noted. Cardiac exam regular rate and rhythm with no murmurs, rubs or bruits. Abdomen is soft. Has good bowel sounds. There is no fluid wave. There is no palpable liver or spleen tip. Abdomen is soft. Has good bowel sounds. There are no palpable liver or spleen tip. Back exam shows no tenderness over the spine ribs or hips. Extremities shows some minimal nonpitting edema of the left leg. No venous cord is noted. He has some stasis dermatitis changes in the left leg. Right leg is chronically swollen.. There is some tenderness to palpation over the right leg, particularly the calf. Skin exam shows no rashes outside of the changes in the right leg. Neurological exam is nonfocal.  Lab Results  Component Value Date   WBC 7.0 02/27/2015   HGB 14.6 02/27/2015   HCT 42.5 02/27/2015   MCV 79* 02/27/2015   PLT 181 02/27/2015     Chemistry      Component Value Date/Time   NA 137 09/29/2014 0830   NA 136 07/17/2014 0800  Component Value Date/Time   CALCIUM 9.2 09/29/2014 0830   CALCIUM 9.4 07/17/2014 0800         Impression and Plan: Lee Phillips is 54 year old gentleman. He is a lifelong anticoagulation. I told him that the nice thing about Xarelto is a never had to check levels.  I think that we can get him back now in about 6 months. He certainly can come back sooner if he has any problems.  I spent about 25-30 minutes with him.       Volanda Napoleon, MD 11/25/20169:08 AM

## 2015-08-21 ENCOUNTER — Other Ambulatory Visit: Payer: Self-pay | Admitting: Hematology & Oncology

## 2015-08-24 ENCOUNTER — Other Ambulatory Visit (HOSPITAL_BASED_OUTPATIENT_CLINIC_OR_DEPARTMENT_OTHER): Payer: Commercial Managed Care - HMO

## 2015-08-24 ENCOUNTER — Ambulatory Visit (HOSPITAL_BASED_OUTPATIENT_CLINIC_OR_DEPARTMENT_OTHER): Payer: Commercial Managed Care - HMO | Admitting: Hematology & Oncology

## 2015-08-24 ENCOUNTER — Encounter: Payer: Self-pay | Admitting: Hematology & Oncology

## 2015-08-24 VITALS — BP 122/78 | HR 63 | Temp 98.7°F | Resp 16 | Ht 74.0 in | Wt 242.0 lb

## 2015-08-24 DIAGNOSIS — I82401 Acute embolism and thrombosis of unspecified deep veins of right lower extremity: Secondary | ICD-10-CM

## 2015-08-24 DIAGNOSIS — D6862 Lupus anticoagulant syndrome: Secondary | ICD-10-CM | POA: Diagnosis not present

## 2015-08-24 DIAGNOSIS — Z7901 Long term (current) use of anticoagulants: Secondary | ICD-10-CM

## 2015-08-24 DIAGNOSIS — I2602 Saddle embolus of pulmonary artery with acute cor pulmonale: Secondary | ICD-10-CM

## 2015-08-24 DIAGNOSIS — Z8672 Personal history of thrombophlebitis: Secondary | ICD-10-CM

## 2015-08-24 DIAGNOSIS — D7582 Heparin induced thrombocytopenia (HIT): Secondary | ICD-10-CM

## 2015-08-24 LAB — CBC WITH DIFFERENTIAL (CANCER CENTER ONLY)
BASO#: 0 10*3/uL (ref 0.0–0.2)
BASO%: 0.3 % (ref 0.0–2.0)
EOS%: 2.4 % (ref 0.0–7.0)
Eosinophils Absolute: 0.2 10*3/uL (ref 0.0–0.5)
HCT: 41 % (ref 38.7–49.9)
HGB: 14.1 g/dL (ref 13.0–17.1)
LYMPH#: 1.8 10*3/uL (ref 0.9–3.3)
LYMPH%: 24.2 % (ref 14.0–48.0)
MCH: 27.9 pg — ABNORMAL LOW (ref 28.0–33.4)
MCHC: 34.4 g/dL (ref 32.0–35.9)
MCV: 81 fL — ABNORMAL LOW (ref 82–98)
MONO#: 0.7 10*3/uL (ref 0.1–0.9)
MONO%: 9.3 % (ref 0.0–13.0)
NEUT#: 4.7 10*3/uL (ref 1.5–6.5)
NEUT%: 63.8 % (ref 40.0–80.0)
PLATELETS: 208 10*3/uL (ref 145–400)
RBC: 5.05 10*6/uL (ref 4.20–5.70)
RDW: 13.3 % (ref 11.1–15.7)
WBC: 7.4 10*3/uL (ref 4.0–10.0)

## 2015-08-24 NOTE — Progress Notes (Signed)
Hematology and Oncology Follow Up Visit  Lee Phillips [id number] [birth year] 55 y.o. 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  Lee Phillips is back for for follow-up.  He recently had prostatitis. He is on ciprofloxacin right now. He's has been on antibiotics for 10 weeks. He says this presented with urinary frequency. He had no fever. He has some pain with urination. He had no bleeding with urination.   He was started on antibiotic but he is not sure which one it was. He now is on Cipro.   Otherwise, he is doing pretty well. He is still working. 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   He was a compression stocking on his right leg all the time. He has chronic post phlebitic syndrome of the right leg. He seems to be managing this pre-well right now.   He's had no bleeding. He's had no diarrhea. He's had no constipation.   He's had no cough or shortness of breath.   He's had no bruising.   Overall, his performance status is ECOG 1.    Medications:  Current outpatient prescriptions:  .  fluticasone (FLONASE) 50 MCG/ACT nasal spray, Place 1 spray into both nostrils daily., Disp: , Rfl:  .  HYDROcodone-acetaminophen (NORCO) 10-325 MG per tablet, Take 10-325 mg by mouth Every 4 hours., Disp: , Rfl:  .  lisinopril-hydrochlorothiazide (PRINZIDE,ZESTORETIC) 20-12.5 MG per tablet, Take 1 tablet by mouth daily., Disp: , Rfl:  .  metoprolol (LOPRESSOR) 25 MG tablet, Take 1 tablet (25 mg total) by mouth 2 (two) times daily., Disp: 60 tablet, Rfl: 0 .  Multiple Vitamins-Minerals (MULTI-VITAMIN GUMMIES PO), Take 2 tablets by mouth daily., Disp: , Rfl:  .  XARELTO 20 MG TABS tablet, Take 1 tablet by mouth  daily with supper, Disp:  90 tablet, Rfl: 3  Allergies:  Allergies  Allergen Reactions  . Heparin Rash    Past Medical History, Surgical history, Social history, and Family History were reviewed and updated.  Review of Systems: As above  Physical Exam:  height is 6\' 2"  (1.88 m) and weight is 242 lb (109.77 kg). His oral temperature is 98.7 F (37.1 C). His blood pressure is 122/78 and his pulse is 63. His respiration is 16.   Well-developed and well-nourished white male. His head and neck exam shows no ocular or oral lesions. He has no scleral icterus. He has no palpable lymph nodes in the neck. Lungs are clear. No rales, wheezes or rhonchi are noted. Cardiac exam regular rate and rhythm with no murmurs, rubs or bruits. Abdomen is soft. Has good bowel sounds. There is no fluid wave. There is no palpable liver or spleen tip. Abdomen is soft. Has good bowel sounds. There are no palpable liver or spleen tip. Back exam shows no tenderness over the spine ribs or hips. Extremities shows some minimal nonpitting edema of the left leg. No venous cord is noted. He has some stasis dermatitis changes in the left leg. Right leg is chronically swollen.. There is some tenderness to palpation over the right leg, particularly the calf. Skin exam shows no rashes outside of the changes in the right leg. Neurological exam is nonfocal.  Lab Results  Component Value Date   WBC 7.4 08/24/2015  HGB 14.1 08/24/2015   HCT 41.0 08/24/2015   MCV 81* 08/24/2015   PLT 208 08/24/2015     Chemistry      Component Value Date/Time   NA 140 02/27/2015 0811   NA 137 09/29/2014 0830   NA 136 07/17/2014 0800      Component Value Date/Time   CALCIUM 10.2 02/27/2015 0811   CALCIUM 9.2 09/29/2014 0830   CALCIUM 9.4 07/17/2014 0800         Impression and Plan: [name] is 55 year old gentleman. He is a lifelong anticoagulation.   Hopefully, the prostatitis will resolve. It sounds like he is getting close to finishing the antibiotics.  Despite being on Xarelto, he did not have any hematuria.  Family that we can probably get him back in another 6 months. I do not see need to do any Dopplers on him.  I don't make sure that he does not get dehydrated as he works outside a lot.     Volanda Napoleon, MD 5/22/20178:20 AM

## 2015-08-28 ENCOUNTER — Other Ambulatory Visit: Payer: Self-pay | Admitting: *Deleted

## 2015-08-28 MED ORDER — ESZOPICLONE 2 MG PO TABS: 2.0000 mg | ORAL_TABLET | Freq: Every evening | ORAL | Status: DC | PRN

## 2015-08-28 MED ORDER — TRAZODONE HCL 50 MG PO TABS: 50.0000 mg | ORAL_TABLET | Freq: Every day | ORAL | Status: DC

## 2015-12-10 ENCOUNTER — Other Ambulatory Visit: Payer: Self-pay | Admitting: Nurse Practitioner

## 2015-12-10 MED ORDER — ESZOPICLONE 2 MG PO TABS: 2.0000 mg | ORAL_TABLET | Freq: Every evening | ORAL | 3 refills | Status: DC | PRN

## 2016-02-01 ENCOUNTER — Telehealth: Payer: Self-pay | Admitting: *Deleted

## 2016-02-01 NOTE — Telephone Encounter (Signed)
Patient wanting to know what medications can be taken OTC to treat his gout. Explained to patient that gout cannot be treated OTC. He can take tylenol for pain, but that to treat gout he needs to be managed by a physician with prescription medication.  Patient has a PCP. Instructed patient to call PCP for appointment for workup and possible medication management. He will follow up.

## 2016-02-24 ENCOUNTER — Ambulatory Visit (HOSPITAL_BASED_OUTPATIENT_CLINIC_OR_DEPARTMENT_OTHER): Payer: Commercial Managed Care - HMO | Admitting: Hematology & Oncology

## 2016-02-24 ENCOUNTER — Other Ambulatory Visit (HOSPITAL_BASED_OUTPATIENT_CLINIC_OR_DEPARTMENT_OTHER): Payer: Commercial Managed Care - HMO

## 2016-02-24 VITALS — BP 135/90 | HR 73 | Temp 98.1°F | Resp 20 | Wt 236.0 lb

## 2016-02-24 DIAGNOSIS — D7582 Heparin induced thrombocytopenia (HIT): Secondary | ICD-10-CM

## 2016-02-24 DIAGNOSIS — I82401 Acute embolism and thrombosis of unspecified deep veins of right lower extremity: Secondary | ICD-10-CM | POA: Diagnosis not present

## 2016-02-24 DIAGNOSIS — I2699 Other pulmonary embolism without acute cor pulmonale: Secondary | ICD-10-CM

## 2016-02-24 DIAGNOSIS — Z8672 Personal history of thrombophlebitis: Secondary | ICD-10-CM

## 2016-02-24 DIAGNOSIS — I2602 Saddle embolus of pulmonary artery with acute cor pulmonale: Secondary | ICD-10-CM

## 2016-02-24 DIAGNOSIS — D6862 Lupus anticoagulant syndrome: Secondary | ICD-10-CM

## 2016-02-24 DIAGNOSIS — M1000 Idiopathic gout, unspecified site: Secondary | ICD-10-CM

## 2016-02-24 LAB — COMPREHENSIVE METABOLIC PANEL
ALT: 25 U/L (ref 0–55)
AST: 21 U/L (ref 5–34)
Albumin: 4.1 g/dL (ref 3.5–5.0)
Alkaline Phosphatase: 77 U/L (ref 40–150)
Anion Gap: 10 mEq/L (ref 3–11)
BUN: 13.8 mg/dL (ref 7.0–26.0)
CO2: 25 meq/L (ref 22–29)
Calcium: 9.5 mg/dL (ref 8.4–10.4)
Chloride: 104 mEq/L (ref 98–109)
Creatinine: 0.9 mg/dL (ref 0.7–1.3)
EGFR: >90 mL/min/{1.73_m2} (ref >90)
Glucose: 118 mg/dl (ref 70–140)
POTASSIUM: 4.2 meq/L (ref 3.5–5.1)
SODIUM: 138 meq/L (ref 136–145)
Total Bilirubin: 1.05 mg/dL (ref 0.20–1.20)
Total Protein: 7.3 g/dL (ref 6.4–8.3)

## 2016-02-24 LAB — CBC WITH DIFFERENTIAL (CANCER CENTER ONLY)
BASO#: 0 10*3/uL (ref 0.0–0.2)
BASO%: 0.3 % (ref 0.0–2.0)
EOS%: 3.1 % (ref 0.0–7.0)
Eosinophils Absolute: 0.2 10*3/uL (ref 0.0–0.5)
HCT: 47.5 % (ref 38.7–49.9)
HGB: 16.8 g/dL (ref 13.0–17.1)
LYMPH#: 1.4 10*3/uL (ref 0.9–3.3)
LYMPH%: 19.3 % (ref 14.0–48.0)
MCH: 27.2 pg — ABNORMAL LOW (ref 28.0–33.4)
MCHC: 35.4 g/dL (ref 32.0–35.9)
MCV: 77 fL — ABNORMAL LOW (ref 82–98)
MONO#: 0.5 10*3/uL (ref 0.1–0.9)
MONO%: 7.5 % (ref 0.0–13.0)
NEUT%: 69.8 % (ref 40.0–80.0)
NEUTROS ABS: 5 10*3/uL (ref 1.5–6.5)
Platelets: 162 10*3/uL (ref 145–400)
RBC: 6.17 10*6/uL — ABNORMAL HIGH (ref 4.20–5.70)
RDW: 14.6 % (ref 11.1–15.7)
WBC: 7.1 10*3/uL (ref 4.0–10.0)

## 2016-02-24 MED ORDER — METHYLPREDNISOLONE 4 MG PO TBPK: ORAL_TABLET | ORAL | 1 refills | Status: DC

## 2016-02-24 NOTE — Progress Notes (Signed)
Hematology and Oncology Follow Up Visit  Lee Phillips:[id number] 23-Nov-1960 55 y.o. 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 His problem right now is that he has what appears to be gout with respect to his left hand. He has had this for a few months. He has not seen his family doctor about this. He is wondering if he can take ibuprofen. I told that he could but only on a full stomach. We will try him on a Medrol dosepak.  Otherwise, he is doing okay. He has a compression stocking for his right leg. He is doing well with the Xarelto. He is working without difficulty. Unfortunately, he is on call tomorrow. He works for the city [employer].  He has had no bleeding or bruising. He's had no cough or shortness of breath. He's had no chest wall pain.  He's had no rashes. He's had no headache.  Overall, his performance status is ECOG 1.    Medications:  Current Outpatient Prescriptions:  .  eszopiclone (LUNESTA) 2 MG TABS tablet, Take 1 tablet (2 mg total) by mouth at bedtime as needed for sleep. Take immediately before bedtime, Disp: 30 tablet, Rfl: 3 .  lisinopril-hydrochlorothiazide (PRINZIDE,ZESTORETIC) 20-12.5 MG per tablet, Take 1 tablet by mouth daily., Disp: , Rfl:  .  methylPREDNISolone (MEDROL DOSEPAK) 4 MG TBPK tablet, Take as directed - 6,5,4,3,2,1 daily for 6 days, Disp: 21 tablet, Rfl: 1 .  metoprolol (LOPRESSOR) 25 MG tablet, Take 1 tablet (25 mg total) by mouth 2 (two) times daily., Disp: 60 tablet, Rfl: 0 .  Multiple Vitamins-Minerals (MULTI-VITAMIN GUMMIES PO), Take 2 tablets by mouth daily., Disp: , Rfl:  .  XARELTO 20 MG TABS tablet, Take 1 tablet by mouth  daily with supper, Disp:  90 tablet, Rfl: 3  Allergies:  Allergies  Allergen Reactions  . Heparin Rash    Past Medical History, Surgical history, Social history, and Family History were reviewed and updated.  Review of Systems: As above  Physical Exam:  weight is 236 lb (107 kg). His oral temperature is 98.1 F (36.7 C). His blood pressure is 135/90 and his pulse is 73. His respiration is 20 and oxygen saturation is 99%.   Well-developed and well-nourished white male. His head and neck exam shows no ocular or oral lesions. He has no scleral icterus. He has no palpable lymph nodes in the neck. Lungs are clear. No rales, wheezes or rhonchi are noted. Cardiac exam regular rate and rhythm with no murmurs, rubs or bruits. Abdomen is soft. Has good bowel sounds. There is no fluid wave. There is no palpable liver or spleen tip. Abdomen is soft. Has good bowel sounds. There are no palpable liver or spleen tip. Back exam shows no tenderness over the spine ribs or hips. Extremities shows some minimal nonpitting edema of the left leg. No venous cord is noted. He has some stasis dermatitis changes in the left leg. Right leg is chronically swollen.. There is some tenderness to palpation over the right leg, particularly the calf. Skin exam shows no rashes outside of the changes in the right leg. Neurological exam is nonfocal.  Lab Results  Component Value Date   WBC 7.1 02/24/2016   HGB  16.8 02/24/2016   HCT 47.5 02/24/2016   MCV 77 (L) 02/24/2016   PLT 162 02/24/2016     Chemistry      Component Value Date/Time   NA 140 02/27/2015 0811      Component Value Date/Time   CALCIUM 10.2 02/27/2015 0811         Impression and Plan: Lee Phillips is 55 year old gentleman. He is a lifelong anticoagulation.   It sounds like he may have some gout in the left hand. Unfortunately,  he has not seen his family doctor.  I will try him on a Medrol Dosepak. Over this will help with some of the inflammation.  If this does not  work, I told him that he probably will need to see his family doctor and possibly get a referral to rheumatology or orthopedic surgery.   From my point of view, he is doing well with respect to his thromboembolic disease. He is wearing his compression stocking and that really is making a big difference.  We will plan to get him back in 6 months. I think this is reasonable.Marland Kitchen   Volanda Napoleon, MD 11/22/20179:45 AM

## 2016-07-05 DIAGNOSIS — I4891 Unspecified atrial fibrillation: Secondary | ICD-10-CM | POA: Diagnosis not present

## 2016-07-05 DIAGNOSIS — Z1159 Encounter for screening for other viral diseases: Secondary | ICD-10-CM | POA: Diagnosis not present

## 2016-07-05 DIAGNOSIS — G4700 Insomnia, unspecified: Secondary | ICD-10-CM | POA: Diagnosis not present

## 2016-07-05 DIAGNOSIS — I10 Essential (primary) hypertension: Secondary | ICD-10-CM | POA: Diagnosis not present

## 2016-08-24 ENCOUNTER — Other Ambulatory Visit (HOSPITAL_BASED_OUTPATIENT_CLINIC_OR_DEPARTMENT_OTHER): Payer: Commercial Managed Care - HMO

## 2016-08-24 ENCOUNTER — Ambulatory Visit (HOSPITAL_BASED_OUTPATIENT_CLINIC_OR_DEPARTMENT_OTHER): Payer: Commercial Managed Care - HMO | Admitting: Hematology & Oncology

## 2016-08-24 VITALS — BP 135/89 | HR 61 | Temp 98.1°F | Resp 18 | Wt 276.1 lb

## 2016-08-24 DIAGNOSIS — D7582 Heparin induced thrombocytopenia (HIT): Secondary | ICD-10-CM | POA: Diagnosis not present

## 2016-08-24 DIAGNOSIS — Z7901 Long term (current) use of anticoagulants: Secondary | ICD-10-CM | POA: Diagnosis not present

## 2016-08-24 DIAGNOSIS — D6862 Lupus anticoagulant syndrome: Secondary | ICD-10-CM

## 2016-08-24 DIAGNOSIS — I82401 Acute embolism and thrombosis of unspecified deep veins of right lower extremity: Secondary | ICD-10-CM | POA: Diagnosis not present

## 2016-08-24 DIAGNOSIS — R635 Abnormal weight gain: Secondary | ICD-10-CM

## 2016-08-24 DIAGNOSIS — Z8672 Personal history of thrombophlebitis: Secondary | ICD-10-CM

## 2016-08-24 DIAGNOSIS — I2699 Other pulmonary embolism without acute cor pulmonale: Secondary | ICD-10-CM

## 2016-08-24 DIAGNOSIS — M1000 Idiopathic gout, unspecified site: Secondary | ICD-10-CM

## 2016-08-24 LAB — CBC WITH DIFFERENTIAL (CANCER CENTER ONLY)
BASO#: 0 10*3/uL (ref 0.0–0.2)
BASO%: 0.4 % (ref 0.0–2.0)
EOS%: 2.8 % (ref 0.0–7.0)
Eosinophils Absolute: 0.2 10*3/uL (ref 0.0–0.5)
HCT: 49.8 % (ref 38.7–49.9)
HGB: 17.2 g/dL — ABNORMAL HIGH (ref 13.0–17.1)
LYMPH#: 1.8 10*3/uL (ref 0.9–3.3)
LYMPH%: 25.1 % (ref 14.0–48.0)
MCH: 27.3 pg — ABNORMAL LOW (ref 28.0–33.4)
MCHC: 34.5 g/dL (ref 32.0–35.9)
MCV: 79 fL — ABNORMAL LOW (ref 82–98)
MONO#: 0.8 10*3/uL (ref 0.1–0.9)
MONO%: 11.4 % (ref 0.0–13.0)
NEUT#: 4.2 10*3/uL (ref 1.5–6.5)
NEUT%: 60.3 % (ref 40.0–80.0)
Platelets: 166 10*3/uL (ref 145–400)
RBC: 6.31 10*6/uL — ABNORMAL HIGH (ref 4.20–5.70)
RDW: 14.3 % (ref 11.1–15.7)
WBC: 7 10*3/uL (ref 4.0–10.0)

## 2016-08-24 LAB — CMP (CANCER CENTER ONLY)
ALK PHOS: 62 U/L (ref 26–84)
ALT(SGPT): 26 U/L (ref 10–47)
AST: 21 U/L (ref 11–38)
Albumin: 4 g/dL (ref 3.3–5.5)
BUN: 12 mg/dL (ref 7–22)
CO2: 28 mEq/L (ref 18–33)
CREATININE: 1.3 mg/dL — AB (ref 0.6–1.2)
Calcium: 9.5 mg/dL (ref 8.0–10.3)
Chloride: 103 mEq/L (ref 98–108)
Glucose, Bld: 100 mg/dL (ref 73–118)
Potassium: 3.9 mEq/L (ref 3.3–4.7)
SODIUM: 141 meq/L (ref 128–145)
TOTAL PROTEIN: 7 g/dL (ref 6.4–8.1)
Total Bilirubin: 1.1 mg/dl (ref 0.20–1.60)

## 2016-08-24 NOTE — Progress Notes (Signed)
Hematology and Oncology Follow Up Visit  JADARIOUS DOBBINS [id number] [birth year] 56 y.o. 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  Stegmann is back for for follow-up. He is doing okay. He is still working for the city of [place]. He is going to  [facility] for [place] Day weekend.  He's had no problems with Xarelto. He has a compression stocking on his right leg. This does help with the swelling and pain to some degree.  He's had no bleeding or bruising. He was cutting down a tree that got taken down by the 20th in April. He bruised his inner left thigh. I looked to this. This appears to be resolving.  He is eating well. He's gained 40 pounds since her last saw him.  He's had no problem with bowels or bladder. He did have prostatitis a year ago. This appears to be better.  He's had no cough. There's been no chest wall pain. He's had no shortness of breath.   Overall, his performance status is ECOG 1.    Medications:  Current Outpatient Prescriptions:  .  lisinopril-hydrochlorothiazide (PRINZIDE,ZESTORETIC) 20-12.5 MG per tablet, Take 1 tablet by mouth daily., Disp: , Rfl:  .  metoprolol (LOPRESSOR) 25 MG tablet, Take 1 tablet (25 mg total) by mouth 2 (two) times daily., Disp: 60 tablet, Rfl: 0 .  Multiple Vitamins-Minerals (MULTI-VITAMIN GUMMIES PO), Take 2 tablets by mouth daily., Disp: , Rfl:  .  XARELTO 20 MG TABS tablet, Take 1 tablet by mouth  daily with supper, Disp: 90 tablet, Rfl: 3  Allergies:  Allergies  Allergen Reactions  . Heparin Rash    Past Medical History, Surgical history, Social history, and Family History were reviewed and updated.  Review of Systems: As above  Physical Exam:  weight is 276  lb 1.9 oz (125.2 kg). His oral temperature is 98.1 F (36.7 C). His blood pressure is 135/89 and his pulse is 61. His respiration is 18 and oxygen saturation is 97%.   Well-developed and well-nourished white male. His head and neck exam shows no ocular or oral lesions. He has no scleral icterus. He has no palpable lymph nodes in the neck. Lungs are clear. No rales, wheezes or rhonchi are noted. Cardiac exam regular rate and rhythm with no murmurs, rubs or bruits. Abdomen is soft. Has good bowel sounds. There is no fluid wave. There is no palpable liver or spleen tip. Abdomen is soft. Has good bowel sounds. There are no palpable liver or spleen tip. Back exam shows no tenderness over the spine ribs or hips. Extremities shows some minimal nonpitting edema of the left leg. No venous cord is noted. He has some stasis dermatitis changes in the left leg. Right leg is chronically swollen.. There is some tenderness to palpation over the right leg, particularly the calf. Skin exam shows no rashes outside of the changes in the right leg. Neurological exam is nonfocal.  Lab Results  Component Value Date   WBC 7.1 02/24/2016   HGB 16.8 02/24/2016   HCT 47.5 02/24/2016   MCV 77 (L) 02/24/2016   PLT 162 02/24/2016     Chemistry      Component Value Date/Time   NA 138 02/24/2016 0753      Component  Value Date/Time   CALCIUM 9.5 02/24/2016 0753         Impression and Plan: Mr. Weyenberg is 56 year old gentleman. He is a lifelong anticoagulation.   He is doing quite well right now. There's been no problems with bleeding or bruising. He did have a little bit of a bruise in the left medial distal thigh. This happened about a month ago. This appears to be improving.  As long as he wears the compression stocking on his right leg, I think he will be okay.  I really talked to him about wearing sunscreen. He does not wear sunscreen. He is going to the beach over [place] Day weekend. I told him that he has to  wear sunscreen and that being on blood thinner will increase his sensitivity to the sun.  I also talked him about his weight. Since we last saw Ms. weight is up 40 pounds. He really has to lose a little weight. This will help the postphlebitic issues with his right leg.  I will like to get him back in 6 months. I think this is reasonable.Marland Kitchen   Volanda Napoleon, MD 5/23/20188:33 AM

## 2016-08-27 ENCOUNTER — Other Ambulatory Visit: Payer: Self-pay | Admitting: Hematology & Oncology

## 2017-02-08 DIAGNOSIS — I10 Essential (primary) hypertension: Secondary | ICD-10-CM | POA: Diagnosis not present

## 2017-02-08 DIAGNOSIS — R05 Cough: Secondary | ICD-10-CM | POA: Diagnosis not present

## 2017-02-27 ENCOUNTER — Other Ambulatory Visit: Payer: Self-pay

## 2017-02-27 ENCOUNTER — Other Ambulatory Visit (HOSPITAL_BASED_OUTPATIENT_CLINIC_OR_DEPARTMENT_OTHER): Payer: 59

## 2017-02-27 ENCOUNTER — Encounter: Payer: Self-pay | Admitting: Hematology & Oncology

## 2017-02-27 ENCOUNTER — Ambulatory Visit (HOSPITAL_BASED_OUTPATIENT_CLINIC_OR_DEPARTMENT_OTHER): Payer: 59 | Admitting: Hematology & Oncology

## 2017-02-27 VITALS — BP 128/86 | HR 71 | Temp 98.0°F | Resp 16 | Wt 276.0 lb

## 2017-02-27 DIAGNOSIS — Z7901 Long term (current) use of anticoagulants: Secondary | ICD-10-CM

## 2017-02-27 DIAGNOSIS — I82401 Acute embolism and thrombosis of unspecified deep veins of right lower extremity: Secondary | ICD-10-CM | POA: Diagnosis not present

## 2017-02-27 DIAGNOSIS — D6862 Lupus anticoagulant syndrome: Secondary | ICD-10-CM | POA: Diagnosis not present

## 2017-02-27 DIAGNOSIS — I2699 Other pulmonary embolism without acute cor pulmonale: Secondary | ICD-10-CM

## 2017-02-27 DIAGNOSIS — D7582 Heparin induced thrombocytopenia (HIT): Secondary | ICD-10-CM

## 2017-02-27 LAB — CMP (CANCER CENTER ONLY)
ALBUMIN: 4 g/dL (ref 3.3–5.5)
ALK PHOS: 63 U/L (ref 26–84)
ALT: 27 U/L (ref 10–47)
AST: 22 U/L (ref 11–38)
BILIRUBIN TOTAL: 1.1 mg/dL (ref 0.20–1.60)
BUN, Bld: 14 mg/dL (ref 7–22)
CALCIUM: 9.5 mg/dL (ref 8.0–10.3)
CO2: 31 mEq/L (ref 18–33)
Chloride: 102 mEq/L (ref 98–108)
Creat: 1.3 mg/dl — ABNORMAL HIGH (ref 0.6–1.2)
Glucose, Bld: 118 mg/dL (ref 73–118)
Potassium: 3.9 mEq/L (ref 3.3–4.7)
Sodium: 141 mEq/L (ref 128–145)
TOTAL PROTEIN: 7.2 g/dL (ref 6.4–8.1)

## 2017-02-27 LAB — CBC WITH DIFFERENTIAL (CANCER CENTER ONLY)
BASO#: 0 10*3/uL (ref 0.0–0.2)
BASO%: 0.3 % (ref 0.0–2.0)
EOS%: 3 % (ref 0.0–7.0)
Eosinophils Absolute: 0.2 10*3/uL (ref 0.0–0.5)
HEMATOCRIT: 48.6 % (ref 38.7–49.9)
HGB: 16.8 g/dL (ref 13.0–17.1)
LYMPH#: 1.6 10*3/uL (ref 0.9–3.3)
LYMPH%: 22.3 % (ref 14.0–48.0)
MCH: 27.4 pg — ABNORMAL LOW (ref 28.0–33.4)
MCHC: 34.6 g/dL (ref 32.0–35.9)
MCV: 79 fL — AB (ref 82–98)
MONO#: 0.5 10*3/uL (ref 0.1–0.9)
MONO%: 6.8 % (ref 0.0–13.0)
NEUT%: 67.6 % (ref 40.0–80.0)
NEUTROS ABS: 4.8 10*3/uL (ref 1.5–6.5)
Platelets: 167 10*3/uL (ref 145–400)
RBC: 6.14 10*6/uL — ABNORMAL HIGH (ref 4.20–5.70)
RDW: 13.9 % (ref 11.1–15.7)
WBC: 7.1 10*3/uL (ref 4.0–10.0)

## 2017-02-27 NOTE — Progress Notes (Signed)
Hematology and Oncology Follow Up Visit  ABRAR BILTON [id number] [birth year] 56 y.o. 02/27/2017   Principle Diagnosis:  New thrombolic disease of the right leg/pulmonary embolism History of right lower extremity DVT with postphlebitic syndrome Lupus anticoagulant positive Heparin-induced thrombocytopenia  Current Therapy:   Xarelto 20 g by mouth daily      Interim History:  Mr.  Byard is back for for follow-up. He is doing okay. He is still working for the city of [place].  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  Unfortunately, he was on call and had to work on Thanksgiving.  He actually took a vacation this summer.  In August, he went to [place].  He had a wonderful time in [place].  He went to [facility].  He was there for about a week.  He really enjoyed it.  He has a compression stocking on his right leg.  This is chronic.  He says if he takes the stocking off, the leg swells up.  He has had no bleeding.  He has had no cough or shortness of breath.  He has had no change in bowel or bladder habits.  He has had no rashes.  Overall, his performance status is ECOG 0.    Medications:  Current Outpatient Medications:  .  lisinopril-hydrochlorothiazide (PRINZIDE,ZESTORETIC) 20-12.5 MG per tablet, Take 1 tablet by mouth daily., Disp: , Rfl:  .  metoprolol (LOPRESSOR) 25 MG tablet, Take 1 tablet (25 mg total) by mouth 2 (two) times daily., Disp: 60 tablet, Rfl: 0 .  Multiple Vitamins-Minerals (MULTI-VITAMIN GUMMIES PO), Take 2 tablets by mouth daily., Disp: , Rfl:  .  XARELTO 20 MG TABS tablet, TAKE 1 TABLET BY MOUTH  DAILY WITH SUPPER, Disp: 90 tablet, Rfl: 6  Allergies:  Allergies  Allergen Reactions  . Heparin Rash    Past Medical History, Surgical history, Social history, and Family History were reviewed and updated.  Review of Systems: As stated in the interim history  Physical Exam:  weight is 276 lb (125.2 kg). His oral temperature is 98 F (36.7 C).  His blood pressure is 128/86 and his pulse is 71. His respiration is 16 and oxygen saturation is 98%.   I examined Mr. Dalbert Batman.  There is also my examination are noted below with any appropriate changes:   Well-developed and well-nourished white male. His head and neck exam shows no ocular or oral lesions. He has no scleral icterus. He has no palpable lymph nodes in the neck. Lungs are clear. No rales, wheezes or rhonchi are noted. Cardiac exam regular rate and rhythm with no murmurs, rubs or bruits. Abdomen is soft. Has good bowel sounds. There is no fluid wave. There is no palpable liver or spleen tip. Abdomen is soft. Has good bowel sounds. There are no palpable liver or spleen tip. Back exam shows no tenderness over the spine ribs or hips. Extremities shows some minimal nonpitting edema of the left leg. No venous cord is noted. He has some stasis dermatitis changes in the left leg. Right leg is chronically swollen.  He has a compression stocking on the right lower leg.  Skin exam shows no rashes outside of the changes in the right leg. Neurological exam is nonfocal.  Lab Results  Component Value Date   WBC 7.0 08/24/2016   HGB 17.2 (H) 08/24/2016   HCT 49.8 08/24/2016   MCV 79 (L) 08/24/2016   PLT 166 08/24/2016     Chemistry      Component  Value Date/Time   NA 141 08/24/2016 0805   NA 138 02/24/2016 0753      Component Value Date/Time   CALCIUM 9.5 08/24/2016 0805   CALCIUM 9.5 02/24/2016 0753         Impression and Plan: Mr. Mendiola is 56 year old gentleman. He is a lifelong anticoagulation.   He is doing quite well right now. There's been no problems with bleeding or bruising. He did have a little bit of a bruise in the left medial distal thigh. This happened about a month ago. This appears to be improving.  As long as he wears the compression stocking on his right leg, I think he will be okay.  I am still surprised by his weight.  He certainly does not look like he weighs  276 pounds.  I will plan to see him back in 6 months.  I must say that this is probably the best that I have seen him look in several years.  I think a lot of this has to do with him being happy with his job.   Volanda Napoleon, MD 11/26/20188:14 AM

## 2017-06-09 ENCOUNTER — Telehealth: Payer: Self-pay | Admitting: *Deleted

## 2017-06-09 NOTE — Telephone Encounter (Signed)
Pt c/o small bright to dark red streaks in his stool that he thinks might be blood. He denies any constipation recently. He does say that he's been eating dried cranberries, but that his last intake was about 3 days ago. He is on the medication Xarelto.  Spoke with Dr Marin Olp. He would like patient to try some preparation H to treat any possible hemorrhoids or broken skin in the rectal area. He wants patient to monitor bleeding. If it become frank in nature patient needs to seek urgent workup in the ED. Patient understands instructions. He will let us know if the bleeding doesn't improve.

## 2017-08-14 ENCOUNTER — Encounter: Payer: Self-pay | Admitting: Hematology & Oncology

## 2017-08-14 ENCOUNTER — Inpatient Hospital Stay: Payer: 59

## 2017-08-14 ENCOUNTER — Inpatient Hospital Stay: Payer: 59 | Attending: Hematology & Oncology | Admitting: Hematology & Oncology

## 2017-08-14 ENCOUNTER — Other Ambulatory Visit: Payer: Self-pay

## 2017-08-14 VITALS — BP 136/82 | HR 59 | Temp 98.0°F | Resp 16 | Wt 281.0 lb

## 2017-08-14 DIAGNOSIS — I87001 Postthrombotic syndrome without complications of right lower extremity: Secondary | ICD-10-CM | POA: Insufficient documentation

## 2017-08-14 DIAGNOSIS — I2782 Chronic pulmonary embolism: Secondary | ICD-10-CM

## 2017-08-14 DIAGNOSIS — D7582 Heparin induced thrombocytopenia (HIT): Secondary | ICD-10-CM | POA: Insufficient documentation

## 2017-08-14 DIAGNOSIS — I82501 Chronic embolism and thrombosis of unspecified deep veins of right lower extremity: Secondary | ICD-10-CM | POA: Insufficient documentation

## 2017-08-14 DIAGNOSIS — D6862 Lupus anticoagulant syndrome: Secondary | ICD-10-CM

## 2017-08-14 DIAGNOSIS — I2699 Other pulmonary embolism without acute cor pulmonale: Secondary | ICD-10-CM

## 2017-08-14 DIAGNOSIS — R635 Abnormal weight gain: Secondary | ICD-10-CM | POA: Insufficient documentation

## 2017-08-14 DIAGNOSIS — I2602 Saddle embolus of pulmonary artery with acute cor pulmonale: Secondary | ICD-10-CM

## 2017-08-14 DIAGNOSIS — Z7901 Long term (current) use of anticoagulants: Secondary | ICD-10-CM | POA: Diagnosis not present

## 2017-08-14 LAB — CMP (CANCER CENTER ONLY)
ALT: 33 U/L (ref 10–47)
AST: 24 U/L (ref 11–38)
Albumin: 4.2 g/dL (ref 3.5–5.0)
Alkaline Phosphatase: 75 U/L (ref 26–84)
Anion gap: 8 (ref 5–15)
BUN: 13 mg/dL (ref 7–22)
CALCIUM: 9.7 mg/dL (ref 8.0–10.3)
CO2: 28 mmol/L (ref 18–33)
CREATININE: 1.2 mg/dL (ref 0.60–1.20)
Chloride: 104 mmol/L (ref 98–108)
GLUCOSE: 113 mg/dL (ref 73–118)
Potassium: 3.8 mmol/L (ref 3.3–4.7)
SODIUM: 140 mmol/L (ref 128–145)
TOTAL PROTEIN: 7.4 g/dL (ref 6.4–8.1)
Total Bilirubin: 1.2 mg/dL (ref 0.2–1.6)

## 2017-08-14 LAB — CBC WITH DIFFERENTIAL (CANCER CENTER ONLY)
BASOS ABS: 0 10*3/uL (ref 0.0–0.1)
Basophils Relative: 0 %
EOS ABS: 0.3 10*3/uL (ref 0.0–0.5)
EOS PCT: 4 %
HCT: 48.1 % (ref 38.7–49.9)
HEMOGLOBIN: 16.4 g/dL (ref 13.0–17.1)
LYMPHS PCT: 24 %
Lymphs Abs: 1.6 10*3/uL (ref 0.9–3.3)
MCH: 27.1 pg — ABNORMAL LOW (ref 28.0–33.4)
MCHC: 34.1 g/dL (ref 32.0–35.9)
MCV: 79.4 fL — AB (ref 82.0–98.0)
Monocytes Absolute: 0.6 10*3/uL (ref 0.1–0.9)
Monocytes Relative: 9 %
NEUTROS PCT: 63 %
Neutro Abs: 4.2 10*3/uL (ref 1.5–6.5)
PLATELETS: 184 10*3/uL (ref 145–400)
RBC: 6.06 MIL/uL — AB (ref 4.20–5.70)
RDW: 14.1 % (ref 11.1–15.7)
WBC: 6.7 10*3/uL (ref 4.0–10.0)

## 2017-08-14 NOTE — Progress Notes (Signed)
Hematology and Oncology Follow Up Visit  Lee Phillips [id number] [birth year] 57 y.o. 08/14/2017   Principle Diagnosis:  New thrombolic disease of the right leg/pulmonary embolism History of right lower extremity DVT with postphlebitic syndrome Lupus anticoagulant positive Heparin-induced thrombocytopenia  Current Therapy:   Xarelto 20 g by mouth daily      Interim History:  [name] is back for for follow-up. He is doing okay. He is still working for the city of [place].  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    He is expecting another granddaughter in July.  He already has 3 granddaughters.  His middle son is going to have a baby in July.  He has a compression stocking on his right leg.  This is chronic.  He says if he takes the stocking off, the leg swells up.  He has had no bleeding.  He has had no cough or shortness of breath.  He has had no change in bowel or bladder habits.  He has had no rashes.  I did get on him about his weight.  His weight keeps going up.  I know that it is hard to exercise with the post phlebitic issue in his right leg.  Overall, his performance status is ECOG 0.    Medications:  Current Outpatient Medications:  .  lisinopril-hydrochlorothiazide (PRINZIDE,ZESTORETIC) 20-12.5 MG per tablet, Take 1 tablet by mouth daily., Disp: , Rfl:  .  metoprolol (LOPRESSOR) 25 MG tablet, Take 1 tablet (25 mg total) by mouth 2 (two) times daily., Disp: 60 tablet, Rfl: 0 .  Multiple Vitamins-Minerals (MULTI-VITAMIN GUMMIES PO), Take 2 tablets by mouth daily., Disp: , Rfl:  .  XARELTO 20 MG TABS tablet, TAKE 1 TABLET BY MOUTH  DAILY WITH SUPPER, Disp: 90 tablet, Rfl: 6  Allergies:  Allergies  Allergen Reactions  . Heparin Rash    Past Medical History, Surgical history, Social history, and Family History were reviewed and updated.  Review of Systems: Review of Systems  Constitutional: Negative.   HENT: Negative.   Eyes: Negative.   Respiratory: Negative.    Cardiovascular: Negative.   Gastrointestinal: Negative.   Genitourinary: Negative.   Musculoskeletal: Negative.   Skin: Negative.   Neurological: Negative.   Endo/Heme/Allergies: Negative.   Psychiatric/Behavioral: Negative.      Physical Exam:  weight is 281 lb (127.5 kg). His oral temperature is 98 F (36.7 C). His blood pressure is 136/82 and his pulse is 59 (abnormal). His respiration is 16 and oxygen saturation is 98%.   Physical Exam  Constitutional: He is oriented to person, place, and time.  HENT:  Head: Normocephalic and atraumatic.  Mouth/Throat: Oropharynx is clear and moist.  Eyes: Pupils are equal, round, and reactive to light. EOM are normal.  Neck: Normal range of motion.  Cardiovascular: Normal rate, regular rhythm and normal heart sounds.  Pulmonary/Chest: Effort normal and breath sounds normal.  Abdominal: Soft. Bowel sounds are normal.  Musculoskeletal: Normal range of motion. He exhibits no edema, tenderness or deformity.  Lymphadenopathy:    He has no cervical adenopathy.  Neurological: He is alert and oriented to person, place, and time.  Skin: Skin is warm and dry. No rash noted. No erythema.  Psychiatric: He has a normal mood and affect. His behavior is normal. Judgment and thought content normal.  Vitals reviewed.    Lab Results  Component Value Date   WBC 6.7 08/14/2017   HGB 16.4 08/14/2017   HCT 48.1 08/14/2017   MCV  79.4 (L) 08/14/2017   PLT 184 08/14/2017     Chemistry      Component Value Date/Time   NA 140 08/14/2017 0755   NA 141 02/27/2017 0745   NA 138 02/24/2016 0753      Component Value Date/Time   CALCIUM 9.7 08/14/2017 0755   CALCIUM 9.5 02/27/2017 0745   CALCIUM 9.5 02/24/2016 0753         Impression and Plan: Lee Phillips is 57 year old gentleman. He is a lifelong anticoagulation.   He is doing quite well right now. There's been no problems with bleeding or bruising. He did have a little bit of a bruise in the  left medial distal thigh. This happened about a month ago. This appears to be improving.  As long as he wears the compression stocking on his right leg, I think he will be okay.  I am still surprised by his weight.  I told him that he really needs to lose some weight.  This will help with his right leg.  I will plan to see him back in 6 months.  I must say that this is probably the best that I have seen him look in several years.  I think a lot of this has to do with him being happy with his job.   Volanda Napoleon, MD 5/13/20198:40 AM

## 2017-08-16 LAB — DRVVT CONFIRM: dRVVT Confirm: >1.5 ratio — ABNORMAL HIGH (ref 0.8–1.2)

## 2017-08-16 LAB — LUPUS ANTICOAGULANT PANEL

## 2017-08-16 LAB — PTT-LA MIX: PTT-LA Mix: 146.8 s — ABNORMAL HIGH (ref 0.0–48.9)

## 2017-08-16 LAB — HEXAGONAL PHASE PHOSPHOLIPID: HEXAGONAL PHASE PHOSPHOLIPID: 96 s — AB (ref 0–11)

## 2017-08-16 LAB — DRVVT MIX

## 2017-08-17 ENCOUNTER — Telehealth: Payer: Self-pay | Admitting: *Deleted

## 2017-08-17 MED ORDER — ASPIRIN EC 81 MG PO TBEC: 81.0000 mg | DELAYED_RELEASE_TABLET | Freq: Every day | ORAL | 0 refills | Status: DC

## 2017-08-17 NOTE — Telephone Encounter (Addendum)
Patient is aware of results. He understands medication instruction. Confirmed with teach back.   ----- Message from Volanda Napoleon, MD sent at 08/16/2017  4:02 PM EDT ----- Call - tell him that the lupus anticoagulant is (+) again.  He needs to add ASA 81mg  po q day to the Xarelto.  He MUST take the aspirin with food and in the morning!!  Laurey Arrow

## 2017-08-18 LAB — CARDIOLIPIN ANTIBODIES, IGG, IGM, IGA
ANTICARDIOLIPIN IGG: 29 GPL U/mL — AB (ref 0–14)
ANTICARDIOLIPIN IGM: 16 [MPL'U]/mL — AB (ref 0–12)

## 2017-09-06 ENCOUNTER — Other Ambulatory Visit: Payer: Self-pay | Admitting: Hematology & Oncology

## 2017-09-20 ENCOUNTER — Emergency Department (HOSPITAL_COMMUNITY): Payer: 59

## 2017-09-20 ENCOUNTER — Observation Stay (HOSPITAL_COMMUNITY)
Admission: EM | Admit: 2017-09-20 | Discharge: 2017-09-22 | DRG: 176 | Disposition: A | Discharge status: Home / Self Care | Payer: 59 | Attending: Internal Medicine | Admitting: Internal Medicine

## 2017-09-20 ENCOUNTER — Encounter (HOSPITAL_COMMUNITY): Payer: Self-pay | Admitting: Emergency Medicine

## 2017-09-20 ENCOUNTER — Other Ambulatory Visit: Payer: Self-pay

## 2017-09-20 DIAGNOSIS — D6862 Lupus anticoagulant syndrome: Secondary | ICD-10-CM | POA: Diagnosis present

## 2017-09-20 DIAGNOSIS — I4891 Unspecified atrial fibrillation: Secondary | ICD-10-CM | POA: Diagnosis present

## 2017-09-20 DIAGNOSIS — Z87891 Personal history of nicotine dependence: Secondary | ICD-10-CM | POA: Diagnosis not present

## 2017-09-20 DIAGNOSIS — I481 Persistent atrial fibrillation: Secondary | ICD-10-CM

## 2017-09-20 DIAGNOSIS — Z86718 Personal history of other venous thrombosis and embolism: Secondary | ICD-10-CM

## 2017-09-20 DIAGNOSIS — I2699 Other pulmonary embolism without acute cor pulmonale: Secondary | ICD-10-CM | POA: Diagnosis not present

## 2017-09-20 DIAGNOSIS — I82411 Acute embolism and thrombosis of right femoral vein: Secondary | ICD-10-CM | POA: Diagnosis present

## 2017-09-20 DIAGNOSIS — Z7982 Long term (current) use of aspirin: Secondary | ICD-10-CM | POA: Diagnosis not present

## 2017-09-20 DIAGNOSIS — R5383 Other fatigue: Secondary | ICD-10-CM | POA: Diagnosis not present

## 2017-09-20 DIAGNOSIS — Z79899 Other long term (current) drug therapy: Secondary | ICD-10-CM

## 2017-09-20 DIAGNOSIS — I890 Lymphedema, not elsewhere classified: Secondary | ICD-10-CM | POA: Diagnosis present

## 2017-09-20 DIAGNOSIS — I10 Essential (primary) hypertension: Secondary | ICD-10-CM

## 2017-09-20 DIAGNOSIS — Z888 Allergy status to other drugs, medicaments and biological substances status: Secondary | ICD-10-CM

## 2017-09-20 DIAGNOSIS — R42 Dizziness and giddiness: Secondary | ICD-10-CM | POA: Diagnosis not present

## 2017-09-20 DIAGNOSIS — Z7901 Long term (current) use of anticoagulants: Secondary | ICD-10-CM

## 2017-09-20 DIAGNOSIS — R0602 Shortness of breath: Secondary | ICD-10-CM | POA: Diagnosis not present

## 2017-09-20 HISTORY — DX: Personal history of other venous thrombosis and embolism: Z86.718

## 2017-09-20 LAB — BASIC METABOLIC PANEL
Anion gap: 7 (ref 5–15)
BUN: 15 mg/dL (ref 6–20)
CALCIUM: 9.7 mg/dL (ref 8.9–10.3)
CO2: 26 mmol/L (ref 22–32)
Chloride: 105 mmol/L (ref 101–111)
Creatinine, Ser: 1.13 mg/dL (ref 0.61–1.24)
GLUCOSE: 125 mg/dL — AB (ref 65–99)
Potassium: 4 mmol/L (ref 3.5–5.1)
Sodium: 138 mmol/L (ref 135–145)

## 2017-09-20 LAB — CBC
HCT: 51.3 % (ref 39.0–52.0)
HEMOGLOBIN: 16.7 g/dL (ref 13.0–17.0)
MCH: 26.5 pg (ref 26.0–34.0)
MCHC: 32.6 g/dL (ref 30.0–36.0)
MCV: 81.3 fL (ref 78.0–100.0)
Platelets: 230 10*3/uL (ref 150–400)
RBC: 6.31 MIL/uL — ABNORMAL HIGH (ref 4.22–5.81)
RDW: 13.7 % (ref 11.5–15.5)
WBC: 7.8 10*3/uL (ref 4.0–10.5)

## 2017-09-20 LAB — I-STAT TROPONIN, ED: TROPONIN I, POC: 0.01 ng/mL (ref 0.00–0.08)

## 2017-09-20 LAB — TROPONIN I
Troponin I: 0.03 ng/mL (ref <0.03)
Troponin I: <0.03 ng/mL (ref <0.03)
Troponin I: <0.03 ng/mL (ref <0.03)

## 2017-09-20 LAB — BRAIN NATRIURETIC PEPTIDE: B Natriuretic Peptide: 49.2 pg/mL (ref 0.0–100.0)

## 2017-09-20 LAB — MAGNESIUM: MAGNESIUM: 2 mg/dL (ref 1.7–2.4)

## 2017-09-20 LAB — APTT: aPTT: 178 seconds (ref 24–36)

## 2017-09-20 MED ORDER — HEPARIN (PORCINE) IN NACL 100-0.45 UNIT/ML-% IJ SOLN
950.0000 [IU]/h | INTRAMUSCULAR | Status: DC
  Administered 2017-09-20: 1600 [IU]/h via INTRAVENOUS
  Administered 2017-09-20: 1350 [IU]/h via INTRAVENOUS
  Administered 2017-09-21: 950 [IU]/h via INTRAVENOUS
  Filled 2017-09-20 (×4): qty 250

## 2017-09-20 MED ORDER — HYDROCHLOROTHIAZIDE 12.5 MG PO CAPS
12.5000 mg | ORAL_CAPSULE | Freq: Every day | ORAL | Status: DC
  Administered 2017-09-21 – 2017-09-22 (×2): 12.5 mg via ORAL
  Filled 2017-09-20 (×2): qty 1

## 2017-09-20 MED ORDER — ACETAMINOPHEN 325 MG PO TABS
650.0000 mg | ORAL_TABLET | Freq: Four times a day (QID) | ORAL | Status: DC | PRN
Start: 2017-09-20 — End: 2017-09-22
  Administered 2017-09-21: 650 mg via ORAL
  Filled 2017-09-20: qty 2

## 2017-09-20 MED ORDER — DOCUSATE SODIUM 100 MG PO CAPS
100.0000 mg | ORAL_CAPSULE | Freq: Two times a day (BID) | ORAL | Status: DC
  Filled 2017-09-20 (×3): qty 1

## 2017-09-20 MED ORDER — ACETAMINOPHEN 650 MG RE SUPP: 650.0000 mg | Freq: Four times a day (QID) | RECTAL | Status: DC | PRN

## 2017-09-20 MED ORDER — LISINOPRIL-HYDROCHLOROTHIAZIDE 20-12.5 MG PO TABS: 1.0000 | ORAL_TABLET | Freq: Every day | ORAL | Status: DC

## 2017-09-20 MED ORDER — METOPROLOL TARTRATE 25 MG PO TABS
25.0000 mg | ORAL_TABLET | Freq: Two times a day (BID) | ORAL | Status: DC
  Administered 2017-09-20 – 2017-09-22 (×4): 25 mg via ORAL
  Filled 2017-09-20 (×4): qty 1

## 2017-09-20 MED ORDER — ZOLPIDEM TARTRATE 5 MG PO TABS
5.0000 mg | ORAL_TABLET | Freq: Every evening | ORAL | Status: DC | PRN
  Administered 2017-09-20 – 2017-09-21 (×2): 5 mg via ORAL
  Filled 2017-09-20 (×2): qty 1

## 2017-09-20 MED ORDER — ASPIRIN EC 81 MG PO TBEC
81.0000 mg | DELAYED_RELEASE_TABLET | Freq: Every day | ORAL | Status: DC
  Administered 2017-09-21: 81 mg via ORAL
  Filled 2017-09-20: qty 1

## 2017-09-20 MED ORDER — SALINE SPRAY 0.65 % NA SOLN
1.0000 | NASAL | Status: DC | PRN
  Administered 2017-09-20: 1 via NASAL
  Filled 2017-09-20 (×2): qty 44

## 2017-09-20 MED ORDER — SODIUM CHLORIDE 0.9% FLUSH
3.0000 mL | Freq: Two times a day (BID) | INTRAVENOUS | Status: DC
  Administered 2017-09-20 – 2017-09-21 (×2): 3 mL via INTRAVENOUS

## 2017-09-20 MED ORDER — IOPAMIDOL (ISOVUE-370) INJECTION 76%
INTRAVENOUS | Status: AC
  Filled 2017-09-20: qty 100

## 2017-09-20 MED ORDER — ONDANSETRON HCL 4 MG/2ML IJ SOLN: 4.0000 mg | Freq: Four times a day (QID) | INTRAMUSCULAR | Status: DC | PRN

## 2017-09-20 MED ORDER — SODIUM CHLORIDE 0.9 % IV BOLUS
1000.0000 mL | Freq: Once | INTRAVENOUS | Status: AC
  Administered 2017-09-20: 1000 mL via INTRAVENOUS

## 2017-09-20 MED ORDER — IOPAMIDOL (ISOVUE-370) INJECTION 76%
100.0000 mL | Freq: Once | INTRAVENOUS | Status: AC
  Administered 2017-09-20: 100 mL via INTRAVENOUS

## 2017-09-20 MED ORDER — LISINOPRIL 20 MG PO TABS
20.0000 mg | ORAL_TABLET | Freq: Every day | ORAL | Status: DC
  Administered 2017-09-21 – 2017-09-22 (×2): 20 mg via ORAL
  Filled 2017-09-20 (×2): qty 1

## 2017-09-20 MED ORDER — ONDANSETRON HCL 4 MG PO TABS: 4.0000 mg | ORAL_TABLET | Freq: Four times a day (QID) | ORAL | Status: DC | PRN

## 2017-09-20 NOTE — H&P (Addendum)
History and Physical    Lee Phillips VVO:[id number] DOB: 04/24/1960 DOA: 09/20/2017  PCP: Carol Ada, MD Consultants:  Marin Olp - hematology Patient coming from:  Home - lives with wife and son; Donald Prose: Wife, [phone]  Chief Complaint:  SOB  HPI: Lee Phillips is a 57 y.o. male with medical history significant of SLE; afib; and HTN presenting with .  He woke up a little SOB this AM and his heart was beating out of rhythm.  He has had a h/o afib for a while.  Also with h/o DVT (chronic leg issues resulting from this with recurrent blood clots)/PE and he was afraid this was back.  He has been on Xarelto and Lopressor, he has absolutely not missed any doses.  His wife thought his leg looks more swollen than the other, but he has chronic lymphedema and he hasn't noticed any more pain/swelling than usual.  He has not been hypoxic.  No cough.  He doesn't feel SOB anymore.  ED Course:  H/o PE, on Xarelto.  Chronic afib, now with RVR.  New PE.  Not on Dilt.  +Heparin.  Troponin 0.03.  Per Dr. Marin Olp, transition to Pradaxa instead.  Needs DVT US and Echo.  Review of Systems: As per HPI; otherwise review of systems reviewed and negative.   Ambulatory Status:  Ambulates without assistance  Past Medical History:  Diagnosis Date  . A-fib ([place])   . Hypertension   . Lupus anticoagulant with hypercoagulable state ([place]) 05/16/2011  . Lymphedema   . Personal history of DVT (deep vein thrombosis)    and PE    Past Surgical History:  Procedure Laterality Date  . NASAL SEPTUM SURGERY      Social History   Socioeconomic History  . Marital status: Married    Spouse name: Not on file  . Number of children: Not on file  . Years of education: Not on file  . Highest education level: Not on file  Occupational History  . Occupation: Paediatric nurse  Social Needs  . Financial resource strain: Not on file  . Food insecurity:    Worry: Not on file    Inability: Not on file  .  Transportation needs:    Medical: Not on file    Non-medical: Not on file  Tobacco Use  . Smoking status: Former Smoker    Packs/day: 1.50    Years: 5.00    Pack years: 7.50    Types: Cigarettes    Start date: 05/22/1973    Last attempt to quit: 07/21/1978    Years since quitting: 39.1  . Smokeless tobacco: Never Used  . Tobacco comment: quit 35 years ago  Substance and Sexual Activity  . Alcohol use: No    Alcohol/week: 0.0 oz  . Drug use: No  . Sexual activity: Not on file  Lifestyle  . Physical activity:    Days per week: Not on file    Minutes per session: Not on file  . Stress: Not on file  Relationships  . Social connections:    Talks on phone: Not on file    Gets together: Not on file    Attends religious service: Not on file    Active member of club or organization: Not on file    Attends meetings of clubs or organizations: Not on file    Relationship status: Not on file  . Intimate partner violence:    Fear of current or ex partner: Not on file  Emotionally abused: Not on file    Physically abused: Not on file    Forced sexual activity: Not on file  Other Topics Concern  . Not on file  Social History Narrative  . Not on file    Allergies  Allergen Reactions  . Heparin Rash    Family History  Problem Relation Age of Onset  . Colon cancer Father 65  . Melanoma Mother 78  . Clotting disorder Neg Hx   . Lupus Neg Hx   . CAD Neg Hx   . Diabetes Mellitus II Neg Hx     Prior to Admission medications   Medication Sig Start Date End Date Taking? Authorizing Provider  aspirin EC 81 MG tablet Take 1 tablet (81 mg total) by mouth daily. 08/17/17  Yes Ennever, Rudell Cobb, MD  lisinopril-hydrochlorothiazide (PRINZIDE,ZESTORETIC) 20-12.5 MG per tablet Take 1 tablet by mouth daily.   Yes [provider]  metoprolol (LOPRESSOR) 25 MG tablet Take 1 tablet (25 mg total) by mouth 2 (two) times daily. 07/18/14  Yes [name], MD  Multiple Vitamins-Minerals  (MULTI-VITAMIN GUMMIES PO) Take 2 tablets by mouth daily.   Yes [provider]  XARELTO 20 MG TABS tablet TAKE 1 TABLET BY MOUTH  DAILY WITH SUPPER 09/07/17  Yes Volanda Napoleon, MD    Physical Exam: Vitals:   09/20/17 1323 09/20/17 1325 09/20/17 1408 09/20/17 1618  BP: 131/87 131/87 131/70 127/79  Pulse: 74 72 70 76  Resp: 18 17  20   Temp:   98.1 F (36.7 C) 98.5 F (36.9 C)  TempSrc:   Oral Oral  SpO2: 97% 98% 99% 100%  Weight:      Height:         General:  Appears calm and comfortable and is NAD Eyes:  PERRL, EOMI, normal lids, iris ENT:  grossly normal hearing, lips & tongue, mmm Neck:  no LAD, masses or thyromegaly Cardiovascular:  RRR, no m/r/g. No LE edema.  Respiratory:   CTA bilaterally with no wheezes/rales/rhonchi.  Normal respiratory effort. Abdomen:  soft, NT, ND, NABS Skin:  no rash or induration seen on limited exam Musculoskeletal:  grossly normal tone BUE/BLE, good ROM, no bony abnormality.  He does have lymphedema of the R lower leg and TED hose was in place.  With removal, there is no erythema or calf TTP, negative squeeze, no cords. Psychiatric:  blunted mood and affect, speech fluent and appropriate, AOx3 Neurologic:  CN 2-12 grossly intact, moves all extremities in coordinated fashion, sensation intact    Radiological Exams on Admission: Dg Chest 2 View  Result Date: 09/20/2017 CLINICAL DATA:  Heart palpitations and shortness of breath since 3 a.m. today. EXAM: CHEST - 2 VIEW COMPARISON:  07/14/2014 FINDINGS: The heart size and mediastinal contours are within normal limits. Both lungs are clear. The visualized skeletal structures are unremarkable. IMPRESSION: No active cardiopulmonary disease. Electronically Signed   By: Lucienne Capers M.D.   On: 09/20/2017 05:22   Ct Angio Chest Pe W Or Wo Contrast  Result Date: 09/20/2017 CLINICAL DATA:  Shortness of breath with lower extremity edema. History of pulmonary embolus. EXAM: CT ANGIOGRAPHY CHEST  WITH CONTRAST TECHNIQUE: Multidetector CT imaging of the chest was performed using the standard protocol during bolus administration of intravenous contrast. Multiplanar CT image reconstructions and MIPs were obtained to evaluate the vascular anatomy. CONTRAST:  100 mL ISOVUE-370 IOPAMIDOL (ISOVUE-370) INJECTION 76% COMPARISON:  CT angiogram chest July 11, 2014 and chest radiograph September 20, 2017 FINDINGS:  Cardiovascular: There is incompletely obstructing pulmonary embolus in a proximal right lower lobe pulmonary artery branch which supplies portions of the lateral and posterior segments of the right lower lobe. This pulmonary embolus is incompletely obstructing. It is difficult to ascertain whether this pulmonary embolus is acute versus residual chronic pulmonary embolus given the extensive pulmonary embolus in this area on prior study. No pulmonary embolus is seen elsewhere. The right ventricle to left ventricle diameter ratio is less than 0.9, not consistent with right heart strain. There is no thoracic aortic aneurysm or dissection. Visualized great vessels appear normal. No pericardial effusion or pericardial thickening evident. The main pulmonary outflow tract measures 3.8 cm in diameter, enlarged. Mediastinum/Nodes: Visualized thyroid appears normal. There are occasional subcentimeter mediastinal lymph nodes. By size criteria there is no frank adenopathy. No esophageal lesions are evident. Lungs/Pleura: There is atelectatic change in the right base posteriorly. There is also atelectatic change in the anterior left base. There is mosaic attenuation throughout the lungs, likely indicative of small airways obstructive disease with atelectasis. There is no frank consolidation. Along the lateral aspect of the minor fissure on the right in the inferior anterior most aspect of the superior segment right lower lobe, there is an 8 x 8 mm nodular opacity. This opacity is best seen on axial slice 70 series 6 as well as  on sagittal slice 18 series 9 and coronal slice 84 series 8. No similar nodular opacities are evident elsewhere. No pleural effusion or pleural thickening elsewhere. Upper Abdomen: Visualized upper abdominal structures appear unremarkable. Musculoskeletal: There are no evident blastic or lytic bone lesions. No chest wall lesions are evident. Review of the MIP images confirms the above findings. IMPRESSION: 1. There is incompletely obstructing pulmonary embolus in a proximal right lower lobe pulmonary artery branch. It is difficult to ascertain whether this pulmonary embolus represents chronic residua from prior pulmonary emboli from 2016 versus a recurrent small pulmonary embolus in this area. No pulmonary emboli elsewhere. No right heart strain. 2. Enlargement of the main pulmonary outflow tract, a finding indicative of pulmonary arterial hypertension. 3. The lungs show mosaic attenuation, likely indicative of small airways obstructive disease and atelectasis. No frank consolidation. 4. 8 mm nodular opacity along the periphery of the minor fissure at the anterior most aspect of the superior segment of the right lower lobe. Non-contrast chest CT at 6-12 months is recommended. If the nodule is stable at time of repeat CT, then future CT at 18-24 months (from today's scan) is considered optional for low-risk patients, but is recommended for high-risk patients. This recommendation follows the consensus statement: Guidelines for Management of Incidental Pulmonary Nodules Detected on CT Images: From the Fleischner Society 2017; Radiology 2017; 284:228-243. 5.  No evident thoracic adenopathy. Critical Value/emergent results were called by telephone at the time of interpretation on 09/20/2017 at 7:45 am to Dr. Duffy Bruce , who verbally acknowledged these results. Electronically Signed   By: Lowella Grip III M.D.   On: 09/20/2017 07:46    EKG: Independently reviewed.  Atrial flutter with rate 111; nonspecific ST  changes with no evidence of acute ischemia   Labs on Admission: I have personally reviewed the available labs and imaging studies at the time of the admission.  Pertinent labs:   Glucose 125, BMP otherwise WNL CBC essentially normal Troponin 0.01, 0.03, <0.03 BNP 49.2  Assessment/Plan Principal Problem:   Pulmonary embolism (HCC) Active Problems:   Essential hypertension   Atrial fibrillation [facility])   PE -Patient  with multiple prior episodes of thromboembolic disease presenting with new DVT/PE -Unfortunately, this is despite absolute reported compliance with Xarelto -Will admit on telemetry -Initiate anticoagulation for now with heparin drip; Dr. Marin Olp recommends 3-4 days of heparin prior to transitioning to Pradaxa -Pradaxa works by inhibiting factor Xa; according to Dr. Marin Olp, the mechanism is the same for Xarelto and Eliquis but different for Pradaxa -He has previously failed Coumadin -He does not smoke and does not have other known risk factors -If the current treatment is unsuccessful, he may need treatment-dose Lovenox indefinitely -I discussed the idea of an IVC filter with Dr. Marin Olp, who reports that there is no role for this in this setting since the patient can be on anticoagulation -Dr. Marin Olp will plan to see the patient tomorrow AM. -He does not need O2. -Will consult CM to assist with financial questions regarding the transition to Pradaxa -Echo ordered to evaluate pulmonary HTN and for R heart strain (not seen on CT)  Afib  -Patient with known afib -He was not rate controlled on arrival but has since become rate controlled -He does not require a Diltiazem drip at this time -Continue Lopressor for rate control  HTN -Continue home meds - lisinopril/HCTZ and lopressor  DVT prophylaxis: Heparin Code Status:  Full - confirmed with patient Family Communication: None present  Disposition Plan:  Home once clinically improved Consults called: Hematology; CM    Admission status: Admit - It is my clinical opinion that admission to INPATIENT is reasonable and necessary because of the expectation that this patient will require hospital care that crosses at least 2 midnights to treat this condition based on the medical complexity of the problems presented.  Given the aforementioned information, the predictability of an adverse outcome is felt to be significant.    Karmen Bongo MD [facility]  If note is complete, please contact covering daytime or nighttime physician. [email or web address] Password [email or web address]  09/20/2017, 5:06 PM

## 2017-09-20 NOTE — Progress Notes (Addendum)
*  Preliminary Results* Bilateral lower extremity venous duplex completed. Right lower extremity is positive for acute deep vein thrombosis involving the right distal femoral vein, right popliteal vein, and right posterior tibial veins. Left lower extremity is negative for deep vein thrombosis. There is no evidence of Baker's cyst bilaterally.  Preliminary results discussed with Dr. Dayna Barker.  09/20/2017 9:02 AM Maudry Mayhew, BS, RVT, RDCS, RDMS

## 2017-09-20 NOTE — Progress Notes (Signed)
ANTICOAGULATION CONSULT NOTE - Initial Consult  Pharmacy Consult for heparin Indication: pulmonary embolus  Allergies  Allergen Reactions  . Heparin Rash    Patient Measurements: Height: 6\' 4"  (193 cm) Weight: 269 lb (122 kg) IBW/kg (Calculated) : 86.8 Heparin Dosing Weight: 112 kg  Vital Signs: Temp: 98.1 F (36.7 C) (06/19 1408) Temp Source: Oral (06/19 1408) BP: 131/87 (06/19 1325) Pulse Rate: 70 (06/19 1408)  Labs: Recent Labs    09/20/17 0456 09/20/17 0610 09/20/17 0818 09/20/17 1432  HGB 16.7  --   --   --   HCT 51.3  --   --   --   PLT 230  --   --   --   APTT  --   --   --  178*  CREATININE 1.13  --   --   --   TROPONINI  --  0.03* <0.03  --     Estimated Creatinine Clearance: 100.5 mL/min (by C-G formula based on SCr of 1.13 mg/dL).   Medical History: Past Medical History:  Diagnosis Date  . A-fib ([place])   . Hypertension   . Lupus anticoagulant with hypercoagulable state ([place]) 05/16/2011  . Lymphedema   . Personal history of DVT (deep vein thrombosis)    and PE     Assessment: 62 yoM with PMH lupus anticoagulant s/p R DVT and PE and AFib on Xarelto PTA. Pt found to have breakthrough PE on CT. Pt denies missed  Of Xarelto (last dose 6/19 0430 am). Per EDP - he discussed with patient's oncologist who recommended going ahead and starting IV heparin with plans for transition to Pradaxa this admission. Given recent dose of DOAC will avoid bolus dose of heparin.   Note pt has hx of heparin allergy documented in chart (rash), per pt he does not remember this and SRA negative for HIT in 2016.  Initial aPTT is very prolonged at 178. Discussed with RN who was in the room with lab when it was drawn. APTT was drawn from the opposite arm as IV Heparin is infusing. Patient has no overt signs or symptoms of bleeding. No heparin boluses recorded as given.   Goal of Therapy:  Heparin level 0.3-0.7 units/ml aPTT 66-102 seconds Monitor platelets by anticoagulation  protocol: Yes   Plan:  -Decrease Heparin to 1350 units/hr.  -Re-check 6-hr aPTT -Daily aPTT/heparin level -F/U plans to transition to Pradaxa  Sloan Leiter, PharmD, BCPS, BCCCP Clinical Pharmacist Clinical phone 09/20/2017 until 3:30PM - #[phone] After hours, please call [phone] 09/20/2017

## 2017-09-20 NOTE — ED Provider Notes (Signed)
[facility] EMERGENCY DEPARTMENT Provider Note   CSN: [id number] Arrival date & time: 09/20/17  0440     History   Chief Complaint Chief Complaint  Patient presents with  . Palpitations    HPI Lee Phillips is a 57 y.o. male.  HPI 57 year old male with past medical history of A. fib, hypertension, lupus anticoagulant on chronic blood thinners, here with palpitations.  The patient states that he awoke this morning with mild palpitations and shortness of breath.  He had some mild lightheadedness.  No overt chest pain.  Symptoms feel similar to his previous PE so he subsequent presents.  He has been taking a low-dose aspirin and his Xarelto without any missed doses.  Denies any current shortness of breath and he feels somewhat better.  Is no longer feeling palpitations.  No other recent medication changes or missed doses.  No recent weight loss.  No fevers or chills.  No cough or sputum production.  No hemoptysis.  Patient states his symptoms were at rest, no specific alleviating or aggravating factors other than time and spontaneous resolution. Past Medical History:  Diagnosis Date  . A-fib ([place])   . Hypertension   . Lupus anticoagulant with hypercoagulable state ([place]) 05/16/2011  . Lymphedema   . Personal history of DVT (deep vein thrombosis)    and PE    Patient Active Problem List   Diagnosis Date Noted  . Fever presenting with conditions classified elsewhere 07/14/2014  . Pulmonary embolism ([place]) 07/11/2014  . Thrombocytopenia ([place]) 07/11/2014  . Elevated troponin 07/11/2014  . Lupus anticoagulant with hypercoagulable state ([place]) 05/16/2011  . OTHER AND UNSPECIFIED COAGULATION DEFECTS 03/02/2010  . Essential hypertension 03/02/2010  . Atrial fibrillation ([place]) 03/02/2010  . LYMPHEDEMA, RIGHT LEG 03/02/2010  . HEMATOCHEZIA 03/02/2010  . DEEP VENOUS THROMBOPHLEBITIS, HX OF 03/02/2010    Past Surgical History:  Procedure Laterality Date  . NASAL  SEPTUM SURGERY          Home Medications    Prior to Admission medications   Medication Sig Start Date End Date Taking? Authorizing Provider  aspirin EC 81 MG tablet Take 1 tablet (81 mg total) by mouth daily. 08/17/17  Yes Ennever, Rudell Cobb, MD  lisinopril-hydrochlorothiazide (PRINZIDE,ZESTORETIC) 20-12.5 MG per tablet Take 1 tablet by mouth daily.   Yes [provider]  metoprolol (LOPRESSOR) 25 MG tablet Take 1 tablet (25 mg total) by mouth 2 (two) times daily. 07/18/14  Yes [name], MD  Multiple Vitamins-Minerals (MULTI-VITAMIN GUMMIES PO) Take 2 tablets by mouth daily.   Yes [provider]  XARELTO 20 MG TABS tablet TAKE 1 TABLET BY MOUTH  DAILY WITH SUPPER 09/07/17  Yes Ennever, Rudell Cobb, MD    Family History Family History  Problem Relation Age of Onset  . Colon cancer Father 65  . Melanoma Mother 67  . Clotting disorder Neg Hx   . Lupus Neg Hx   . CAD Neg Hx   . Diabetes Mellitus II Neg Hx     Social History Social History   Tobacco Use  . Smoking status: Former Smoker    Packs/day: 1.50    Years: 5.00    Pack years: 7.50    Types: Cigarettes    Start date: 05/22/1973    Last attempt to quit: 07/21/1978    Years since quitting: 39.1  . Smokeless tobacco: Never Used  . Tobacco comment: quit 35 years ago  Substance Use Topics  . Alcohol use: No  Alcohol/week: 0.0 oz  . Drug use: No     Allergies   Heparin   Review of Systems Review of Systems  Constitutional: Positive for fatigue. Negative for chills and fever.  HENT: Negative for ear pain and sore throat.   Eyes: Negative for pain and visual disturbance.  Respiratory: Positive for shortness of breath. Negative for cough.   Cardiovascular: Negative for chest pain and palpitations.  Gastrointestinal: Negative for abdominal pain and vomiting.  Genitourinary: Negative for dysuria and hematuria.  Musculoskeletal: Negative for arthralgias and back pain.  Skin: Negative for color  change and rash.  Neurological: Positive for weakness. Negative for seizures and syncope.  All other systems reviewed and are negative.    Physical Exam Updated Vital Signs BP 127/84   Pulse 72   Temp 98.7 F (37.1 C) (Oral)   Resp 14   Ht 6\' 4"  (1.93 m)   Wt 122 kg (269 lb)   SpO2 97%   BMI 32.74 kg/m   Physical Exam  Constitutional: He is oriented to person, place, and time. He appears well-developed and well-nourished. No distress.  HENT:  Head: Normocephalic and atraumatic.  Eyes: Conjunctivae are normal.  Neck: Neck supple.  Cardiovascular: Normal rate, regular rhythm and normal heart sounds. Exam reveals no friction rub.  No murmur heard. Occasionally irregular  Pulmonary/Chest: Effort normal and breath sounds normal. No respiratory distress. He has no wheezes. He has no rales.  Abdominal: He exhibits no distension.  Musculoskeletal: He exhibits edema (Asymmetric edema right lower extremity with no popliteal pain or tenderness.  Distal pulses 2+ bilaterally.).  Neurological: He is alert and oriented to person, place, and time. He exhibits normal muscle tone.  Skin: Skin is warm. Capillary refill takes less than 2 seconds.  Psychiatric: He has a normal mood and affect.  Nursing note and vitals reviewed.    ED Treatments / Results  Labs (all labs ordered are listed, but only abnormal results are displayed) Labs Reviewed  BASIC METABOLIC PANEL - Abnormal; Notable for the following components:      Result Value   Glucose, Bld 125 (*)    All other components within normal limits  CBC - Abnormal; Notable for the following components:   RBC 6.31 (*)    All other components within normal limits  TROPONIN I - Abnormal; Notable for the following components:   Troponin I 0.03 (*)    All other components within normal limits  BRAIN NATRIURETIC PEPTIDE  MAGNESIUM  TROPONIN I  APTT  I-STAT TROPONIN, ED    EKG EKG Interpretation  Date/Time:  Wednesday September 20 2017  04:47:38 EDT Ventricular Rate:  111 PR Interval:    QRS Duration: 106 QT Interval:  316 QTC Calculation: 429 R Axis:   89 Text Interpretation:  Atrial flutter with variable A-V block Incomplete right bundle branch block Anteroseptal infarct , age undetermined Abnormal ECG Since last EKG, AFlutter has replaced sinus rhythm Confirmed by Duffy Bruce [phone]) on 09/20/2017 6:22:56 AM Also confirmed by Duffy Bruce [phone]), editor Shon Hale [phone])  on 09/20/2017 7:52:40 AM   Radiology Dg Chest 2 View  Result Date: 09/20/2017 CLINICAL DATA:  Heart palpitations and shortness of breath since 3 a.m. today. EXAM: CHEST - 2 VIEW COMPARISON:  07/14/2014 FINDINGS: The heart size and mediastinal contours are within normal limits. Both lungs are clear. The visualized skeletal structures are unremarkable. IMPRESSION: No active cardiopulmonary disease. Electronically Signed   By: Lucienne Capers M.D.   On: 09/20/2017  05:22   Ct Angio Chest Pe W Or Wo Contrast  Result Date: 09/20/2017 CLINICAL DATA:  Shortness of breath with lower extremity edema. History of pulmonary embolus. EXAM: CT ANGIOGRAPHY CHEST WITH CONTRAST TECHNIQUE: Multidetector CT imaging of the chest was performed using the standard protocol during bolus administration of intravenous contrast. Multiplanar CT image reconstructions and MIPs were obtained to evaluate the vascular anatomy. CONTRAST:  100 mL ISOVUE-370 IOPAMIDOL (ISOVUE-370) INJECTION 76% COMPARISON:  CT angiogram chest July 11, 2014 and chest radiograph September 20, 2017 FINDINGS: Cardiovascular: There is incompletely obstructing pulmonary embolus in a proximal right lower lobe pulmonary artery branch which supplies portions of the lateral and posterior segments of the right lower lobe. This pulmonary embolus is incompletely obstructing. It is difficult to ascertain whether this pulmonary embolus is acute versus residual chronic pulmonary embolus given the extensive pulmonary  embolus in this area on prior study. No pulmonary embolus is seen elsewhere. The right ventricle to left ventricle diameter ratio is less than 0.9, not consistent with right heart strain. There is no thoracic aortic aneurysm or dissection. Visualized great vessels appear normal. No pericardial effusion or pericardial thickening evident. The main pulmonary outflow tract measures 3.8 cm in diameter, enlarged. Mediastinum/Nodes: Visualized thyroid appears normal. There are occasional subcentimeter mediastinal lymph nodes. By size criteria there is no frank adenopathy. No esophageal lesions are evident. Lungs/Pleura: There is atelectatic change in the right base posteriorly. There is also atelectatic change in the anterior left base. There is mosaic attenuation throughout the lungs, likely indicative of small airways obstructive disease with atelectasis. There is no frank consolidation. Along the lateral aspect of the minor fissure on the right in the inferior anterior most aspect of the superior segment right lower lobe, there is an 8 x 8 mm nodular opacity. This opacity is best seen on axial slice 70 series 6 as well as on sagittal slice 18 series 9 and coronal slice 84 series 8. No similar nodular opacities are evident elsewhere. No pleural effusion or pleural thickening elsewhere. Upper Abdomen: Visualized upper abdominal structures appear unremarkable. Musculoskeletal: There are no evident blastic or lytic bone lesions. No chest wall lesions are evident. Review of the MIP images confirms the above findings. IMPRESSION: 1. There is incompletely obstructing pulmonary embolus in a proximal right lower lobe pulmonary artery branch. It is difficult to ascertain whether this pulmonary embolus represents chronic residua from prior pulmonary emboli from 2016 versus a recurrent small pulmonary embolus in this area. No pulmonary emboli elsewhere. No right heart strain. 2. Enlargement of the main pulmonary outflow tract, a  finding indicative of pulmonary arterial hypertension. 3. The lungs show mosaic attenuation, likely indicative of small airways obstructive disease and atelectasis. No frank consolidation. 4. 8 mm nodular opacity along the periphery of the minor fissure at the anterior most aspect of the superior segment of the right lower lobe. Non-contrast chest CT at 6-12 months is recommended. If the nodule is stable at time of repeat CT, then future CT at 18-24 months (from today's scan) is considered optional for low-risk patients, but is recommended for high-risk patients. This recommendation follows the consensus statement: Guidelines for Management of Incidental Pulmonary Nodules Detected on CT Images: From the Fleischner Society 2017; Radiology 2017; 284:228-243. 5.  No evident thoracic adenopathy. Critical Value/emergent results were called by telephone at the time of interpretation on 09/20/2017 at 7:45 am to Dr. Duffy Bruce , who verbally acknowledged these results. Electronically Signed   By: Lowella Grip III  M.D.   On: 09/20/2017 07:46    Procedures .Critical Care Performed by: Duffy Bruce, MD Authorized by: Duffy Bruce, MD   Critical care provider statement:    Critical care time (minutes):  35   Critical care time was exclusive of:  Separately billable procedures and treating other patients and teaching time   Critical care was necessary to treat or prevent imminent or life-threatening deterioration of the following conditions:  Circulatory failure, cardiac failure and respiratory failure   Critical care was time spent personally by me on the following activities:  Development of treatment plan with patient or surrogate, discussions with consultants, evaluation of patient's response to treatment, examination of patient, obtaining history from patient or surrogate, ordering and performing treatments and interventions, ordering and review of laboratory studies, ordering and review of  radiographic studies, pulse oximetry, re-evaluation of patient's condition and review of old charts   I assumed direction of critical care for this patient from another provider in my specialty: no     (including critical care time)  Medications Ordered in ED Medications  heparin ADULT infusion 100 units/mL (25000 units/22mL sodium chloride 0.45%) (1,600 Units/hr Intravenous New Bag/Given 09/20/17 0921)  sodium chloride 0.9 % bolus 1,000 mL (0 mLs Intravenous Stopped 09/20/17 0720)  iopamidol (ISOVUE-370) 76 % injection 100 mL (100 mLs Intravenous Contrast Given 09/20/17 2035)     Initial Impression / Assessment and Plan / ED Course  I have reviewed the triage vital signs and the nursing notes.  Pertinent labs & imaging results that were available during my care of the patient were reviewed by me and considered in my medical decision making (see chart for details).     57 year old male with history of lupus anticoagulant syndrome status post large right DVT and PE here with shortness of breath and palpitations.  On arrival, patient noted to be in intermittent A. fib as well as a brief run of a flutter.  Troponin positive at 0.03.  He has no known coronary disease, so concerned that this could be demand.  EKG shows no ST elevations.  Repeat CT angios does show a right lower lobe pulmonary embolism.  Although chronicity is on clear, given his new palpitations, positive troponin, and shortness of breath, concern for possible breakthrough PE.  I discussed with his oncologist, Dr. Marin Olp, he does feel the patient would likely need to be switched to a new anticoagulant.  He recommends Pradaxa.  Recommends repeat ultrasound and temporary anticoagulation to see if patient has increased clot burden/need for DVT filter.  Given his ongoing symptoms, admit for work-up and anticoagulation.  Final Clinical Impressions(s) / ED Diagnoses   Final diagnoses:  Acute pulmonary embolism without acute cor  pulmonale, unspecified pulmonary embolism type [facility])    ED Discharge Orders    None       Duffy Bruce, MD 09/20/17 1023

## 2017-09-20 NOTE — ED Notes (Signed)
Patient returned from CT

## 2017-09-20 NOTE — ED Triage Notes (Signed)
Pt reports heart palpitations that awoke him from sleep last night, reports hx of PE 5 yrs ago, currently still on anticoags, endorses sob at times. Resp e/u, nad. Denies cp at this time.

## 2017-09-20 NOTE — ED Notes (Signed)
Patient ambulated around hallway maintaining heart rate 88-100. Denied any pain, shortness of breathe or palpitations.

## 2017-09-20 NOTE — Progress Notes (Signed)
ANTICOAGULATION CONSULT NOTE - Initial Consult  Pharmacy Consult for heparin Indication: pulmonary embolus  Allergies  Allergen Reactions  . Heparin Rash    Patient Measurements:   Heparin Dosing Weight: 112 kg  Vital Signs: Temp: 98.1 F (36.7 C) (06/19 0449) Temp Source: Oral (06/19 0449) BP: 126/81 (06/19 0800) Pulse Rate: 87 (06/19 0800)  Labs: Recent Labs    09/20/17 0456 09/20/17 0610  HGB 16.7  --   HCT 51.3  --   PLT 230  --   CREATININE 1.13  --   TROPONINI  --  0.03*    CrCl cannot be calculated (Unknown ideal weight.).   Medical History: Past Medical History:  Diagnosis Date  . A-fib ([place])   . Hypertension   . Irregular heart beat   . Lupus anticoagulant with hypercoagulable state ([place]) 05/16/2011  . Lymphedema      Assessment: 66 yoM with PMH lupus anticoagulant s/p R DVT and PE and AFib on Xarelto PTA. Pt found to have breakthrough PE on CT. Pt denies missed  Of Xarelto (last dose 6/19 0430 am). Per EDP - he discussed with patient's oncologist who recommended going ahead and starting IV heparin with plans for transition to Pradaxa this admission. Given recent dose of DOAC will avoid bolus dose of heparin.   Note pt has hx of heparin allergy documented in chart (rash), per pt he does not remember this and SRA negative for HIT in 2016.  Goal of Therapy:  Heparin level 0.3-0.7 units/ml aPTT 66-102 seconds Monitor platelets by anticoagulation protocol: Yes   Plan:  -Heparin 1600 units/hr -Check 6-hr aPTT -Daily aPTT/heparin level -F/U plans to transition to Pradaxa  Arrie Senate, PharmD, [place] PGY-2 Cardiology Pharmacy Resident Clinical Phone: [phone] 09/20/2017

## 2017-09-21 ENCOUNTER — Inpatient Hospital Stay (HOSPITAL_COMMUNITY): Payer: 59

## 2017-09-21 DIAGNOSIS — I2699 Other pulmonary embolism without acute cor pulmonale: Secondary | ICD-10-CM | POA: Diagnosis not present

## 2017-09-21 DIAGNOSIS — I5030 Unspecified diastolic (congestive) heart failure: Secondary | ICD-10-CM

## 2017-09-21 LAB — BASIC METABOLIC PANEL
ANION GAP: 9 (ref 5–15)
BUN: 10 mg/dL (ref 6–20)
CALCIUM: 9.4 mg/dL (ref 8.9–10.3)
CO2: 28 mmol/L (ref 22–32)
CREATININE: 1.15 mg/dL (ref 0.61–1.24)
Chloride: 104 mmol/L (ref 101–111)
GFR calc Af Amer: >60 mL/min (ref >60)
GLUCOSE: 102 mg/dL — AB (ref 65–99)
Potassium: 4.3 mmol/L (ref 3.5–5.1)
Sodium: 141 mmol/L (ref 135–145)

## 2017-09-21 LAB — CBC
HCT: 47.9 % (ref 39.0–52.0)
Hemoglobin: 15.6 g/dL (ref 13.0–17.0)
MCH: 26.3 pg (ref 26.0–34.0)
MCHC: 32.6 g/dL (ref 30.0–36.0)
MCV: 80.8 fL (ref 78.0–100.0)
PLATELETS: 191 10*3/uL (ref 150–400)
RBC: 5.93 MIL/uL — ABNORMAL HIGH (ref 4.22–5.81)
RDW: 13.7 % (ref 11.5–15.5)
WBC: 6.7 10*3/uL (ref 4.0–10.5)

## 2017-09-21 LAB — TROPONIN I
Troponin I: <0.03 ng/mL (ref <0.03)
Troponin I: <0.03 ng/mL (ref <0.03)

## 2017-09-21 LAB — HEPARIN LEVEL (UNFRACTIONATED)
HEPARIN UNFRACTIONATED: 2.14 [IU]/mL — AB (ref 0.30–0.70)
HEPARIN UNFRACTIONATED: 1.1 [IU]/mL — AB (ref 0.30–0.70)

## 2017-09-21 LAB — APTT
APTT: 115 s — AB (ref 24–36)
aPTT: 145 seconds — ABNORMAL HIGH (ref 24–36)
aPTT: 83 seconds — ABNORMAL HIGH (ref 24–36)

## 2017-09-21 LAB — HIV ANTIBODY (ROUTINE TESTING W REFLEX): HIV Screen 4th Generation wRfx: NONREACTIVE

## 2017-09-21 LAB — ECHOCARDIOGRAM COMPLETE
Height: 76 in
WEIGHTICAEL: 4304 [oz_av]

## 2017-09-21 NOTE — Progress Notes (Signed)
  Echocardiogram 2D Echocardiogram has been performed.  Jennette Dubin 09/21/2017, 2:46 PM

## 2017-09-21 NOTE — Progress Notes (Signed)
ANTICOAGULATION CONSULT NOTE - Follow Up Consult  Pharmacy Consult for heparin Indication: pulmonary embolus  No Active Allergies  Patient Measurements: Height: 6\' 4"  (193 cm) Weight: 269 lb (122 kg) IBW/kg (Calculated) : 86.8 Heparin Dosing Weight: 112 kg  Vital Signs: Temp: 98.1 F (36.7 C) (06/20 0829) Temp Source: Oral (06/20 0829) BP: 136/80 (06/20 0829) Pulse Rate: 70 (06/20 0829)  Labs: Recent Labs    09/20/17 0456  09/20/17 1432 09/20/17 1822 09/20/17 2312 09/21/17 0606 09/21/17 0924  HGB 16.7  --   --   --   --  15.6  --   HCT 51.3  --   --   --   --  47.9  --   PLT 230  --   --   --   --  191  --   APTT  --   --  178*  --  145*  --  115*  HEPARINUNFRC  --   --   --   --  2.14*  --  1.10*  CREATININE 1.13  --   --   --   --  1.15  --   TROPONINI  --    < >  --  <0.03 <0.03 <0.03  --    < > = values in this interval not displayed.    Estimated Creatinine Clearance: 98.7 mL/min (by C-G formula based on SCr of 1.15 mg/dL).  Assessment: CC/HPI: new PE/dvt  PMH: hx of dvt/pe on xarelto pta, afib, htn  Anticoag: Xarelto PTA for hx AFib, DVT, PE, lupus AC - breakthrough PE to start IV heparin now per onc recs APTT 115, HL 1.1 on 1100 units/hr  Renal: SCr 1.15  Heme/Onc: H&H 15.6/47.9, PLt 191  Goal of Therapy:  Heparin level 0.3-0.7 units/ml aPTT 66 - 102 seconds Monitor platelets by anticoagulation protocol: Yes   Plan:  -Dec heparin to 950 units/hr -2000 aPTT -Daily aPTT/heparin level -F/U plans to transition to Pradaxa (per onc)  Levester Fresh, PharmD, BCPS, BCCCP Clinical Pharmacist Clinical phone for 09/21/2017 from 7a-3:30p: [phone] If after 3:30p, please call main pharmacy at: [phone] 09/21/2017 12:28 PM

## 2017-09-21 NOTE — Progress Notes (Signed)
PROGRESS NOTE    Lee Phillips  OAC:[id number] DOB: Sep 06, 1960 DOA: 09/20/2017 PCP: Carol Ada, MD   Brief Narrative: Patient is a 57 year old male with past medical history of systolic, A. fib, hypertension, PE ,DVT ,who presented to the emergency department with complaints of shortness of breath and palpitations.  When patient presented he was found to be in A. fib with RVR.  CT imaging done in the emergency department was positive for possible new right lower lobe PE.  Also found to have a new right lower extremity DVT.  Patient started on heparin drip.  Assessment & Plan:   Principal Problem:   Pulmonary embolism (HCC) Active Problems:   Essential hypertension   Atrial fibrillation (HCC)  Pulmonary embolism/DVT: New finding of pulmonary embolism and DVT in the right lower extremity.  He has history of DVT and PE in the past and was on Xarelto at home .He had failed Coumadin in the past .Follows with Dr. Marin Olp who will follow the patient here. Venous duplex showed  acute DVT in the Femoral vein, Popliteal vein, and Posterior Tibial veins.  CT imaging done in the emergency department was positive for possible new right lower lobe PE. Dr. Marin Olp is planning to switch anticoagulation to Pradaxa.  I think,it  is also reasonable to consider IVC filter for this patient.  Will wait for hematology/oncology recommendation.  Continue heparin drip for now. Patient's respiratory status is stable. We will follow-up echocardiogram.  A. fib: Known history of A. fib.  Currently heart rate is controlled but was in A. fib with RVR on presentation.  Did not require Cardizem drip.  hypertension: Currently blood pressure stable.  Continue his home medications lisinopril/hydrochlorothiazide and Lopressor .    DVT prophylaxis: heparin IV Code Status: Full Family Communication: Family member/friend present at the bedside Disposition Plan: Home in 1 to 2 days   Consultants:  Oncology  Procedures: None  Antimicrobials: None  Subjective: Patient seen and examined the bedside this morning.  Remains comfortable.  He is not in any kind of respiratory distress.  Heart rate is controlled.  No new complaints.  Objective: Vitals:   09/20/17 1408 09/20/17 1618 09/21/17 0019 09/21/17 0829  BP: 131/70 127/79 115/78 136/80  Pulse: 70 76 66 70  Resp:  20  (!) 24  Temp: 98.1 F (36.7 C) 98.5 F (36.9 C) 98.7 F (37.1 C) 98.1 F (36.7 C)  TempSrc: Oral Oral Oral Oral  SpO2: 99% 100% 97% 93%  Weight:      Height:        Intake/Output Summary (Last 24 hours) at 09/21/2017 1050 Last data filed at 09/21/2017 0700 Gross per 24 hour  Intake 1438.59 ml  Output 600 ml  Net 838.59 ml   Filed Weights   09/20/17 0828  Weight: 122 kg (269 lb)    Examination:  General exam: Appears calm and comfortable ,Not in distress,average built HEENT:PERRL,Oral mucosa moist, Ear/Nose normal on gross exam Respiratory system: Bilateral equal air entry, normal vesicular breath sounds, no wheezes or crackles  Cardiovascular system: S1 & S2 heard, RRR. No JVD, murmurs, rubs, gallops or clicks.  Right lower extremity edema Gastrointestinal system: Abdomen is nondistended, soft and nontender. No organomegaly or masses felt. Normal bowel sounds heard. Central nervous system: Alert and oriented. No focal neurological deficits. Extremities: Right lower extremity edema, no clubbing ,no cyanosis, distal peripheral pulses palpable. Skin: No rashes, lesions or ulcers,no icterus ,no pallor MSK: Normal muscle bulk,tone ,power Psychiatry: Judgement and insight appear  normal. Mood & affect appropriate.     Data Reviewed: I have personally reviewed following labs and imaging studies  CBC: Recent Labs  Lab 09/20/17 0456 09/21/17 0606  WBC 7.8 6.7  HGB 16.7 15.6  HCT 51.3 47.9  MCV 81.3 80.8  PLT 230 638   Basic Metabolic Panel: Recent Labs  Lab 09/20/17 0456 09/20/17 0610  09/21/17 0606  NA 138  --  141  K 4.0  --  4.3  CL 105  --  104  CO2 26  --  28  GLUCOSE 125*  --  102*  BUN 15  --  10  CREATININE 1.13  --  1.15  CALCIUM 9.7  --  9.4  MG  --  2.0  --    GFR: Estimated Creatinine Clearance: 98.7 mL/min (by C-G formula based on SCr of 1.15 mg/dL). Liver Function Tests: No results for input(s): AST, ALT, ALKPHOS, BILITOT, PROT, ALBUMIN in the last 168 hours. No results for input(s): LIPASE, AMYLASE in the last 168 hours. No results for input(s): AMMONIA in the last 168 hours. Coagulation Profile: No results for input(s): INR, PROTIME in the last 168 hours. Cardiac Enzymes: Recent Labs  Lab 09/20/17 0610 09/20/17 0818 09/20/17 1822 09/20/17 2312 09/21/17 0606  TROPONINI 0.03* <0.03 <0.03 <0.03 <0.03   BNP (last 3 results) No results for input(s): PROBNP in the last 8760 hours. HbA1C: No results for input(s): HGBA1C in the last 72 hours. CBG: No results for input(s): GLUCAP in the last 168 hours. Lipid Profile: No results for input(s): CHOL, HDL, LDLCALC, TRIG, CHOLHDL, LDLDIRECT in the last 72 hours. Thyroid Function Tests: No results for input(s): TSH, T4TOTAL, FREET4, T3FREE, THYROIDAB in the last 72 hours. Anemia Panel: No results for input(s): VITAMINB12, FOLATE, FERRITIN, TIBC, IRON, RETICCTPCT in the last 72 hours. Sepsis Labs: No results for input(s): PROCALCITON, LATICACIDVEN in the last 168 hours.  No results found for this or any previous visit (from the past 240 hour(s)).       Radiology Studies: Dg Chest 2 View  Result Date: 09/20/2017 CLINICAL DATA:  Heart palpitations and shortness of breath since 3 a.m. today. EXAM: CHEST - 2 VIEW COMPARISON:  07/14/2014 FINDINGS: The heart size and mediastinal contours are within normal limits. Both lungs are clear. The visualized skeletal structures are unremarkable. IMPRESSION: No active cardiopulmonary disease. Electronically Signed   By: Lucienne Capers M.D.   On: 09/20/2017  05:22   Ct Angio Chest Pe W Or Wo Contrast  Result Date: 09/20/2017 CLINICAL DATA:  Shortness of breath with lower extremity edema. History of pulmonary embolus. EXAM: CT ANGIOGRAPHY CHEST WITH CONTRAST TECHNIQUE: Multidetector CT imaging of the chest was performed using the standard protocol during bolus administration of intravenous contrast. Multiplanar CT image reconstructions and MIPs were obtained to evaluate the vascular anatomy. CONTRAST:  100 mL ISOVUE-370 IOPAMIDOL (ISOVUE-370) INJECTION 76% COMPARISON:  CT angiogram chest July 11, 2014 and chest radiograph September 20, 2017 FINDINGS: Cardiovascular: There is incompletely obstructing pulmonary embolus in a proximal right lower lobe pulmonary artery branch which supplies portions of the lateral and posterior segments of the right lower lobe. This pulmonary embolus is incompletely obstructing. It is difficult to ascertain whether this pulmonary embolus is acute versus residual chronic pulmonary embolus given the extensive pulmonary embolus in this area on prior study. No pulmonary embolus is seen elsewhere. The right ventricle to left ventricle diameter ratio is less than 0.9, not consistent with right heart strain. There is no thoracic aortic  aneurysm or dissection. Visualized great vessels appear normal. No pericardial effusion or pericardial thickening evident. The main pulmonary outflow tract measures 3.8 cm in diameter, enlarged. Mediastinum/Nodes: Visualized thyroid appears normal. There are occasional subcentimeter mediastinal lymph nodes. By size criteria there is no frank adenopathy. No esophageal lesions are evident. Lungs/Pleura: There is atelectatic change in the right base posteriorly. There is also atelectatic change in the anterior left base. There is mosaic attenuation throughout the lungs, likely indicative of small airways obstructive disease with atelectasis. There is no frank consolidation. Along the lateral aspect of the minor fissure on  the right in the inferior anterior most aspect of the superior segment right lower lobe, there is an 8 x 8 mm nodular opacity. This opacity is best seen on axial slice 70 series 6 as well as on sagittal slice 18 series 9 and coronal slice 84 series 8. No similar nodular opacities are evident elsewhere. No pleural effusion or pleural thickening elsewhere. Upper Abdomen: Visualized upper abdominal structures appear unremarkable. Musculoskeletal: There are no evident blastic or lytic bone lesions. No chest wall lesions are evident. Review of the MIP images confirms the above findings. IMPRESSION: 1. There is incompletely obstructing pulmonary embolus in a proximal right lower lobe pulmonary artery branch. It is difficult to ascertain whether this pulmonary embolus represents chronic residua from prior pulmonary emboli from 2016 versus a recurrent small pulmonary embolus in this area. No pulmonary emboli elsewhere. No right heart strain. 2. Enlargement of the main pulmonary outflow tract, a finding indicative of pulmonary arterial hypertension. 3. The lungs show mosaic attenuation, likely indicative of small airways obstructive disease and atelectasis. No frank consolidation. 4. 8 mm nodular opacity along the periphery of the minor fissure at the anterior most aspect of the superior segment of the right lower lobe. Non-contrast chest CT at 6-12 months is recommended. If the nodule is stable at time of repeat CT, then future CT at 18-24 months (from today's scan) is considered optional for low-risk patients, but is recommended for high-risk patients. This recommendation follows the consensus statement: Guidelines for Management of Incidental Pulmonary Nodules Detected on CT Images: From the Fleischner Society 2017; Radiology 2017; 284:228-243. 5.  No evident thoracic adenopathy. Critical Value/emergent results were called by telephone at the time of interpretation on 09/20/2017 at 7:45 am to Dr. Duffy Bruce , who  verbally acknowledged these results. Electronically Signed   By: Lowella Grip III M.D.   On: 09/20/2017 07:46        Scheduled Meds: . aspirin EC  81 mg Oral Daily  . docusate sodium  100 mg Oral BID  . lisinopril  20 mg Oral Daily   And  . hydrochlorothiazide  12.5 mg Oral Daily  . metoprolol tartrate  25 mg Oral BID  . sodium chloride flush  3 mL Intravenous Q12H   Continuous Infusions: . heparin 1,100 Units/hr (09/21/17 0128)     LOS: 1 day    Time spent: 35 mins.More than 50% of that time was spent in counseling and/or coordination of care.      Shelly Coss, MD [facility] Pager [phone]  If 7PM-7AM, please contact night-coverage [email or web address] Password [email or web address] 09/21/2017, 10:50 AM

## 2017-09-21 NOTE — Progress Notes (Signed)
ANTICOAGULATION CONSULT NOTE - Follow Up Consult  Pharmacy Consult for heparin Indication: pulmonary embolus  No Active Allergies  Patient Measurements: Height: 6\' 4"  (193 cm) Weight: 269 lb (122 kg) IBW/kg (Calculated) : 86.8 Heparin Dosing Weight: 112 kg  Vital Signs: Temp: 98.6 F (37 C) (06/20 1646) Temp Source: Oral (06/20 1646) BP: 133/84 (06/20 1646) Pulse Rate: 69 (06/20 1646)  Labs: Recent Labs    09/20/17 0456  09/20/17 1822 09/20/17 2312 09/21/17 0606 09/21/17 0924 09/21/17 2038  HGB 16.7  --   --   --  15.6  --   --   HCT 51.3  --   --   --  47.9  --   --   PLT 230  --   --   --  191  --   --   APTT  --    < >  --  145*  --  115* 83*  HEPARINUNFRC  --   --   --  2.14*  --  1.10*  --   CREATININE 1.13  --   --   --  1.15  --   --   TROPONINI  --    < > <0.03 <0.03 <0.03  --   --    < > = values in this interval not displayed.    Estimated Creatinine Clearance: 98.7 mL/min (by C-G formula based on SCr of 1.15 mg/dL).  Assessment: 57 year old male with new PE/dvt while on Xarelto for hisotry of Afib, DVT, PE and Lupus AC. Now switched to IV heparin for acute treatment.   Due to recent Xarelto, heparin levels are inaccurate at this time and monitoring IV Heparin by aPTT levels.   APTT this PM is therapeutic at 83 on 950 units/hr of IV Heparin. No bleeding has been reported. CBC is stable.   Goal of Therapy:  Heparin level 0.3-0.7 units/ml aPTT 66 - 102 seconds Monitor platelets by anticoagulation protocol: Yes   Plan:  -Continue heparin to 950 units/hr -Daily aPTT/heparin level -F/U plans to transition to Pradaxa (per onc)  Sloan Leiter, PharmD, BCPS, BCCCP Clinical Pharmacist Clinical phone 09/21/2017 until 10:30PM - #[phone] After hours, please call [phone] 09/21/2017 9:46 PM

## 2017-09-21 NOTE — Progress Notes (Signed)
[facility] for Heparin Indication: pulmonary embolus  Allergies  Allergen Reactions  . Heparin Rash    Patient Measurements: Height: 6\' 4"  (193 cm) Weight: 269 lb (122 kg) IBW/kg (Calculated) : 86.8 Heparin Dosing Weight: 112 kg  Vital Signs: Temp: 98.7 F (37.1 C) (06/20 0019) Temp Source: Oral (06/20 0019) BP: 115/78 (06/20 0019) Pulse Rate: 66 (06/20 0019)  Labs: Recent Labs    09/20/17 0456  09/20/17 0818 09/20/17 1432 09/20/17 1822 09/20/17 2312  HGB 16.7  --   --   --   --   --   HCT 51.3  --   --   --   --   --   PLT 230  --   --   --   --   --   APTT  --   --   --  178*  --  145*  HEPARINUNFRC  --   --   --   --   --  2.14*  CREATININE 1.13  --   --   --   --   --   TROPONINI  --    < > <0.03  --  <0.03 <0.03   < > = values in this interval not displayed.    Estimated Creatinine Clearance: 100.5 mL/min (by C-G formula based on SCr of 1.13 mg/dL).   Medical History: Past Medical History:  Diagnosis Date  . A-fib ([place])   . Hypertension   . Lupus anticoagulant with hypercoagulable state ([place]) 05/16/2011  . Lymphedema   . Personal history of DVT (deep vein thrombosis)    and PE     Assessment: 52 yoM with PMH lupus anticoagulant s/p R DVT and PE and AFib on Xarelto PTA. Pt found to have breakthrough PE on CT. Pt denies missed  Of Xarelto (last dose 6/19 0430 am). Per EDP - he discussed with patient's oncologist who recommended going ahead and starting IV heparin with plans for transition to Pradaxa this admission. Given recent dose of DOAC will avoid bolus dose of heparin.   Note pt has hx of heparin allergy documented in chart (rash), per pt he does not remember this and SRA negative for HIT in 2016.  APTT remains prolonged at 144. Heparin level elevated at 2.14 as expected due to Xarelto use. Currently using aPTT to dose. No issues per RN.   Goal of Therapy:  Heparin level 0.3-0.7 units/ml aPTT 66-102  seconds Monitor platelets by anticoagulation protocol: Yes   Plan:  Dec heparin to 1100 units/hr 1000 aPTT/HL  Narda Bonds, PharmD, BCPS Clinical Pharmacist Phone: [phone]

## 2017-09-22 DIAGNOSIS — Z7982 Long term (current) use of aspirin: Secondary | ICD-10-CM

## 2017-09-22 DIAGNOSIS — I824Z1 Acute embolism and thrombosis of unspecified deep veins of right distal lower extremity: Secondary | ICD-10-CM

## 2017-09-22 DIAGNOSIS — D6862 Lupus anticoagulant syndrome: Secondary | ICD-10-CM

## 2017-09-22 DIAGNOSIS — I2699 Other pulmonary embolism without acute cor pulmonale: Secondary | ICD-10-CM | POA: Diagnosis not present

## 2017-09-22 DIAGNOSIS — Z87891 Personal history of nicotine dependence: Secondary | ICD-10-CM

## 2017-09-22 DIAGNOSIS — Z7901 Long term (current) use of anticoagulants: Secondary | ICD-10-CM | POA: Diagnosis not present

## 2017-09-22 LAB — CBC
HCT: 47.8 % (ref 39.0–52.0)
Hemoglobin: 15.6 g/dL (ref 13.0–17.0)
MCH: 26.2 pg (ref 26.0–34.0)
MCHC: 32.6 g/dL (ref 30.0–36.0)
MCV: 80.3 fL (ref 78.0–100.0)
PLATELETS: 201 10*3/uL (ref 150–400)
RBC: 5.95 MIL/uL — AB (ref 4.22–5.81)
RDW: 13.5 % (ref 11.5–15.5)
WBC: 6.6 10*3/uL (ref 4.0–10.5)

## 2017-09-22 MED ORDER — DABIGATRAN ETEXILATE MESYLATE 150 MG PO CAPS
150.0000 mg | ORAL_CAPSULE | Freq: Two times a day (BID) | ORAL | Status: DC
  Administered 2017-09-22: 150 mg via ORAL
  Filled 2017-09-22: qty 1

## 2017-09-22 MED ORDER — ASPIRIN 81 MG PO CHEW: 162.0000 mg | CHEWABLE_TABLET | Freq: Every day | ORAL | 0 refills | Status: AC

## 2017-09-22 MED ORDER — DABIGATRAN ETEXILATE MESYLATE 150 MG PO CAPS: 150.0000 mg | ORAL_CAPSULE | Freq: Two times a day (BID) | ORAL | 0 refills | Status: DC

## 2017-09-22 MED ORDER — ASPIRIN 81 MG PO CHEW
162.0000 mg | CHEWABLE_TABLET | Freq: Every day | ORAL | Status: DC
  Administered 2017-09-22: 162 mg via ORAL
  Filled 2017-09-22: qty 2

## 2017-09-22 NOTE — Progress Notes (Signed)
Pt informed and verbally agreed to be discharged. Pt alert and oriented x4, vital signs stable on room air. Removed pt IV and went over discharge instructions with pt. Pt had clothing and cell phone in hand. Answered all questions. Pt declined to be wheeled off unit by nurse tech and walked off unit with nurse tech.

## 2017-09-22 NOTE — Care Management Note (Addendum)
Case Management Note  Patient Details  Name: Lee Phillips MRN: [id number] Date of Birth: 1961-01-16  Subjective/Objective:     NCM gave patient pradaxa coupon. pta indep.              # 3.  S/W  JACK @ [facility] # [id number]    1. PRADAXA 150 MG BID  COVER- NONE PREFERRED  PRIOR APPROVAL- YES # [id number] FOR EXCEPTION   2. DEBIGATRAN  COVER- NONE PREFERRED  PRIOR APPROVAL- YES # [phone] FOR EXCEPTION   ALTERNATIVE :  1. XARELTO 15 MG BID  COVER- YES  CO-PAY- $ 70.00  TIER- 2 DRUG  PRIOR APPROVAL- NO    PATIENT HAS LIMITATION ON ACCOUNT  NO DEDUCTIBLE  NO OUT-OF-POCKET   PREFERRED PHARMACY : YES [facility]  Action/Plan: Dc home.   Expected Discharge Date:  09/22/17               Expected Discharge Plan:  Home/Self Care  In-House Referral:     Discharge planning Services  CM Consult, Medication Assistance  Post Acute Care Choice:    Choice offered to:     DME Arranged:    DME Agency:     HH Arranged:    HH Agency:     Status of Service:  Completed, signed off  If discussed at [employer] of Stay Meetings, dates discussed:    Additional Comments:  Zenon Mayo, RN 09/22/2017, 11:12 AM

## 2017-09-22 NOTE — Consult Note (Signed)
Referral MD  Reason for Referral: Recurrent pulmonary emboli and right lower extremity thrombus  Chief Complaint  Patient presents with  . Palpitations  : I have another blood clot.  HPI: [name] is well-known to me.  He is a 57 year old white male.  I have known him for over 12-13 years.  Ever since I have known him, he has never had a problem with recurrent thromboembolic disease.  He has been on Xarelto.  He does have a lupus anticoagulant.  He is been working.  He has had no long distance travel.  Earlier this week, he began to have some chest discomfort.  He has some shortness of breath.  He came to the emergency room.  He was found to have an abnormality in the proximal right lower lobe.  He also was found to have some atrial fibrillation.  A Doppler was done of his legs.  He had an acute thrombus in the right leg.  This extended from the femoral vein down to the posterior tibial vein.  He was started on heparin.  He feels better.  He does have this lupus anticoagulant.  I have to believe that this is somehow a factor.  He has had no fever.  He has had no rashes.  He has had no change in bowel or bladder habits.  He said that his wife thought that his right leg was little bit more swollen.  He does have a compression stocking that he is been wearing.  He has had no nausea or vomiting.  He has had no bleeding.  Overall, his performance status is ECOG 1.   Past Medical History:  Diagnosis Date  . A-fib ([place])   . Hypertension   . Lupus anticoagulant with hypercoagulable state ([place]) 05/16/2011  . Lymphedema   . Personal history of DVT (deep vein thrombosis)    and PE  :  Past Surgical History:  Procedure Laterality Date  . NASAL SEPTUM SURGERY    :   Current Facility-Administered Medications:  .  acetaminophen (TYLENOL) tablet 650 mg, 650 mg, Oral, Q6H PRN, 650 mg at 09/21/17 1931 **OR** acetaminophen (TYLENOL) suppository 650 mg, 650 mg, Rectal, Q6H PRN, Karmen Bongo, MD .  aspirin EC tablet 81 mg, 81 mg, Oral, Daily, Karmen Bongo, MD, 81 mg at 09/21/17 0904 .  docusate sodium (COLACE) capsule 100 mg, 100 mg, Oral, BID, Karmen Bongo, MD .  heparin ADULT infusion 100 units/mL (25000 units/262mL sodium chloride 0.45%), 950 Units/hr, Intravenous, Continuous, Wynell Balloon, RPH, Last Rate: 9.5 mL/hr at 09/21/17 1932, 950 Units/hr at 09/21/17 1932 .  lisinopril (PRINIVIL,ZESTRIL) tablet 20 mg, 20 mg, Oral, Daily, 20 mg at 09/21/17 0904 **AND** hydrochlorothiazide (MICROZIDE) capsule 12.5 mg, 12.5 mg, Oral, Daily, Karmen Bongo, MD, 12.5 mg at 09/21/17 0904 .  metoprolol tartrate (LOPRESSOR) tablet 25 mg, 25 mg, Oral, BID, Karmen Bongo, MD, 25 mg at 09/21/17 2209 .  ondansetron (ZOFRAN) tablet 4 mg, 4 mg, Oral, Q6H PRN **OR** ondansetron (ZOFRAN) injection 4 mg, 4 mg, Intravenous, Q6H PRN, Karmen Bongo, MD .  sodium chloride (OCEAN) 0.65 % nasal spray 1 spray, 1 spray, Each Nare, PRN, Karmen Bongo, MD, 1 spray at 09/20/17 2218 .  sodium chloride flush (NS) 0.9 % injection 3 mL, 3 mL, Intravenous, Q12H, Karmen Bongo, MD, 3 mL at 09/21/17 0907 .  zolpidem (AMBIEN) tablet 5 mg, 5 mg, Oral, QHS PRN, Schorr, Rhetta Mura, NP, 5 mg at 09/21/17 2209:  . aspirin EC  81  mg Oral Daily  . docusate sodium  100 mg Oral BID  . lisinopril  20 mg Oral Daily   And  . hydrochlorothiazide  12.5 mg Oral Daily  . metoprolol tartrate  25 mg Oral BID  . sodium chloride flush  3 mL Intravenous Q12H  :  No Active Allergies:  Family History  Problem Relation Age of Onset  . Colon cancer Father 107  . Melanoma Mother 62  . Clotting disorder Neg Hx   . Lupus Neg Hx   . CAD Neg Hx   . Diabetes Mellitus II Neg Hx   :  Social History   Socioeconomic History  . Marital status: Married    Spouse name: Not on file  . Number of children: Not on file  . Years of education: Not on file  . Highest education level: Not on file  Occupational History  .  Occupation: Paediatric nurse  Social Needs  . Financial resource strain: Not on file  . Food insecurity:    Worry: Not on file    Inability: Not on file  . Transportation needs:    Medical: Not on file    Non-medical: Not on file  Tobacco Use  . Smoking status: Former Smoker    Packs/day: 1.50    Years: 5.00    Pack years: 7.50    Types: Cigarettes    Start date: 05/22/1973    Last attempt to quit: 07/21/1978    Years since quitting: 39.2  . Smokeless tobacco: Never Used  . Tobacco comment: quit 35 years ago  Substance and Sexual Activity  . Alcohol use: No    Alcohol/week: 0.0 oz  . Drug use: No  . Sexual activity: Not on file  Lifestyle  . Physical activity:    Days per week: Not on file    Minutes per session: Not on file  . Stress: Not on file  Relationships  . Social connections:    Talks on phone: Not on file    Gets together: Not on file    Attends religious service: Not on file    Active member of club or organization: Not on file    Attends meetings of clubs or organizations: Not on file    Relationship status: Not on file  . Intimate partner violence:    Fear of current or ex partner: Not on file    Emotionally abused: Not on file    Physically abused: Not on file    Forced sexual activity: Not on file  Other Topics Concern  . Not on file  Social History Narrative  . Not on file  :  Pertinent items are noted in HPI.  Exam: Patient Vitals for the past 24 hrs:  BP Temp Temp src Pulse Resp SpO2  09/21/17 2342 103/75 97.8 F (36.6 C) Oral 64 16 96 %  09/21/17 1646 133/84 98.6 F (37 C) Oral 69 18 94 %  09/21/17 0829 136/80 98.1 F (36.7 C) Oral 70 (!) 24 93 %     Recent Labs    09/21/17 0606 09/22/17 0236  WBC 6.7 6.6  HGB 15.6 15.6  HCT 47.9 47.8  PLT 191 201   Recent Labs    09/20/17 0456 09/21/17 0606  NA 138 141  K 4.0 4.3  CL 105 104  CO2 26 28  GLUCOSE 125* 102*  BUN 15 10  CREATININE 1.13 1.15  CALCIUM 9.7 9.4     Blood smear review: None  Pathology:  None    Assessment and Plan: [name] is a 57 year old white male.  He has a recurrent pulmonary embolism.  He has a new thrombus in the right lower leg.  He has been on Xarelto.  He has been very diligent with taking Xarelto.  I would consider switching over to Pradaxa.  This is a direct thrombin inhibitor.  This is a different mechanism of action than Xarelto and Eliquis.  As such, I think this might be reasonable.  Given the fact that he has this lupus anticoagulant, I think we are going to have to put him on aspirin.  I put him on low-dose aspirin.  I would consider 162 mg dosing.  I do not see any obvious underlying malignancy.  I would not go on any type of "hot" for cancer.  It sounds like he might be able to go home today.  I would have no problems with this.  He seems hemodynamically stable.  His exam is relatively unremarkable.  He does need to wear the compression stocking on his right leg most of the time right now.  I would repeat his CT angiogram and lower extremity Doppler in about 3 months.  This way, we can see what type of response we have gotten from Pradaxa and low-dose aspirin.  He must take aspirin with food.  I will plan to see him back in about 3 weeks.  I appreciate all the great care that he received from everybody over on 2 W.   Lattie Haw, MD  Jeneen Rinks 1:5-7

## 2017-09-22 NOTE — Progress Notes (Signed)
ANTICOAGULATION CONSULT NOTE - Follow Up Consult  Pharmacy Consult for heparin Indication: pulmonary embolus  No Active Allergies  Patient Measurements: Height: 6\' 4"  (193 cm) Weight: 269 lb (122 kg) IBW/kg (Calculated) : 86.8 Heparin Dosing Weight: 112 kg  Vital Signs: Temp: 97.8 F (36.6 C) (06/20 2342) Temp Source: Oral (06/20 2342) BP: 103/75 (06/20 2342) Pulse Rate: 64 (06/20 2342)  Labs: Recent Labs    09/20/17 0456  09/20/17 1822 09/20/17 2312 09/21/17 0606 09/21/17 0924 09/21/17 2038 09/22/17 0236  HGB 16.7  --   --   --  15.6  --   --  15.6  HCT 51.3  --   --   --  47.9  --   --  47.8  PLT 230  --   --   --  191  --   --  201  APTT  --    < >  --  145*  --  115* 83*  --   HEPARINUNFRC  --   --   --  2.14*  --  1.10*  --   --   CREATININE 1.13  --   --   --  1.15  --   --   --   TROPONINI  --    < > <0.03 <0.03 <0.03  --   --   --    < > = values in this interval not displayed.    Estimated Creatinine Clearance: 98.7 mL/min (by C-G formula based on SCr of 1.15 mg/dL).  Assessment: 57 year old male with new PE/dvt while on Xarelto for hisotry of Afib, DVT, PE and Lupus AC. Now switched to IV heparin for acute treatment.   Due to recent Xarelto, heparin levels are inaccurate at this time and monitoring IV Heparin by aPTT levels.   APTT this PM is therapeutic at 83 on 950 units/hr of IV Heparin. No bleeding has been reported. CBC is stable.   Plan:  DC heparin Dabigatran 150 mg bid (early conversion off heparin - should be 5 days - MD aware but patient going home today)  Levester Fresh, PharmD, BCPS, BCCCP Clinical Pharmacist Clinical phone for 09/22/2017 from 7a-3:30p: [phone] If after 3:30p, please call main pharmacy at: [phone] 09/22/2017 7:56 AM

## 2017-09-22 NOTE — Discharge Summary (Signed)
Physician Discharge Summary  Lee Phillips GYF:[id number] DOB: [birth year] DOA: 09/20/2017  PCP: Lee Ada, MD  Admit date: 09/20/2017 Discharge date: 09/22/2017  Admitted From: Home Disposition:  Home  Discharge Condition:Stable CODE STATUS:FULL Diet recommendation: Heart Healthy  Brief/Interim Summary:  Patient is a 57 year old male with past medical history of systolic, A. fib, hypertension, PE ,DVT , presents of lupus anticoagulant,who presented to the emergency department with complaints of shortness of breath and palpitations.  When patient presented he was found to be in A. fib with RVR.  CT imaging done in the emergency department was positive for possible new right lower lobe PE.  Also found to have a new right lower extremity DVT.  Patient started on heparin drip on admission.  His anticoagulation has been changed to Pradaxa on discharge.Patient is stable for discharge home today.  Following problems  were addressed during his hospitalization:   Pulmonary embolism/DVT: New finding of pulmonary embolism and DVT in the right lower extremity.  He has history of DVT and PE in the past and was on Xarelto at home .He had failed Coumadin in the past .Follows with Dr. Marin Phillips who will follow the patient here.  He has history of presence of lupus anticoagulant. Venous duplex showed  acute DVT in the Femoral vein, Popliteal vein, and Posterior Tibial veins.  CT imaging done in the emergency department was positive for possible new right lower lobe PE. Dr. Marin Phillips decided to switch anticoagulation to Pradaxa. Echocardiogram showed normal left ventricular systolic function, grade 2 diastolic dysfunction.  Patient does not have any generalized peripheral edema so I will hold on starting any diuretics for now.  A. fib: Known history of A. fib.  Currently heart rate is controlled but was in A. fib with RVR on presentation.  Did not require Cardizem drip.  Hypertension: Currently blood  pressure stable.   Discharge Diagnoses:  Principal Problem:   Pulmonary embolism ([place]) Active Problems:   Essential hypertension   Atrial fibrillation [facility])    Discharge Instructions  Discharge Instructions    Diet - low sodium heart healthy   Complete by:  As directed    Discharge instructions   Complete by:  As directed    1) Take prescribed medications as instructed. 2) Follow up with your PCP in a week.  Follow-up with your hematologist as an outpatient.   Increase activity slowly   Complete by:  As directed      Allergies as of 09/22/2017   No Active Allergies     Medication List    STOP taking these medications   aspirin EC 81 MG tablet Replaced by:  aspirin 81 MG chewable tablet     TAKE these medications   aspirin 81 MG chewable tablet Chew 2 tablets (162 mg total) by mouth daily. Start taking on:  09/23/2017 Replaces:  aspirin EC 81 MG tablet   dabigatran 150 MG Caps capsule Commonly known as:  PRADAXA Take 1 capsule (150 mg total) by mouth every 12 (twelve) hours.   lisinopril-hydrochlorothiazide 20-12.5 MG tablet Commonly known as:  PRINZIDE,ZESTORETIC Take 1 tablet by mouth daily.   metoprolol tartrate 25 MG tablet Commonly known as:  LOPRESSOR Take 1 tablet (25 mg total) by mouth 2 (two) times daily.   MULTI-VITAMIN GUMMIES PO Take 2 tablets by mouth daily.      Follow-up Information    Lee Ada, MD. Schedule an appointment as soon as possible for a visit in 1 week(s).   Specialty:  Family  Medicine Contact information: [address], [place] [address] [phone]          No Active Allergies  Consultations: Hematology  Procedures/Studies: Dg Chest 2 View  Result Date: 09/20/2017 CLINICAL DATA:  Heart palpitations and shortness of breath since 3 a.m. today. EXAM: CHEST - 2 VIEW COMPARISON:  07/14/2014 FINDINGS: The heart size and mediastinal contours are within normal limits. Both lungs are clear. The  visualized skeletal structures are unremarkable. IMPRESSION: No active cardiopulmonary disease. Electronically Signed   By: Lee Phillips M.D.   On: 09/20/2017 05:22   Ct Angio Chest Pe W Or Wo Contrast  Result Date: 09/20/2017 CLINICAL DATA:  Shortness of breath with lower extremity edema. History of pulmonary embolus. EXAM: CT ANGIOGRAPHY CHEST WITH CONTRAST TECHNIQUE: Multidetector CT imaging of the chest was performed using the standard protocol during bolus administration of intravenous contrast. Multiplanar CT image reconstructions and MIPs were obtained to evaluate the vascular anatomy. CONTRAST:  100 mL ISOVUE-370 IOPAMIDOL (ISOVUE-370) INJECTION 76% COMPARISON:  CT angiogram chest July 11, 2014 and chest radiograph September 20, 2017 FINDINGS: Cardiovascular: There is incompletely obstructing pulmonary embolus in a proximal right lower lobe pulmonary artery branch which supplies portions of the lateral and posterior segments of the right lower lobe. This pulmonary embolus is incompletely obstructing. It is difficult to ascertain whether this pulmonary embolus is acute versus residual chronic pulmonary embolus given the extensive pulmonary embolus in this area on prior study. No pulmonary embolus is seen elsewhere. The right ventricle to left ventricle diameter ratio is less than 0.9, not consistent with right heart strain. There is no thoracic aortic aneurysm or dissection. Visualized great vessels appear normal. No pericardial effusion or pericardial thickening evident. The main pulmonary outflow tract measures 3.8 cm in diameter, enlarged. Mediastinum/Nodes: Visualized thyroid appears normal. There are occasional subcentimeter mediastinal lymph nodes. By size criteria there is no frank adenopathy. No esophageal lesions are evident. Lungs/Pleura: There is atelectatic change in the right base posteriorly. There is also atelectatic change in the anterior left base. There is mosaic attenuation throughout  the lungs, likely indicative of small airways obstructive disease with atelectasis. There is no frank consolidation. Along the lateral aspect of the minor fissure on the right in the inferior anterior most aspect of the superior segment right lower lobe, there is an 8 x 8 mm nodular opacity. This opacity is best seen on axial slice 70 series 6 as well as on sagittal slice 18 series 9 and coronal slice 84 series 8. No similar nodular opacities are evident elsewhere. No pleural effusion or pleural thickening elsewhere. Upper Abdomen: Visualized upper abdominal structures appear unremarkable. Musculoskeletal: There are no evident blastic or lytic bone lesions. No chest wall lesions are evident. Review of the MIP images confirms the above findings. IMPRESSION: 1. There is incompletely obstructing pulmonary embolus in a proximal right lower lobe pulmonary artery branch. It is difficult to ascertain whether this pulmonary embolus represents chronic residua from prior pulmonary emboli from 2016 versus a recurrent small pulmonary embolus in this area. No pulmonary emboli elsewhere. No right heart strain. 2. Enlargement of the main pulmonary outflow tract, a finding indicative of pulmonary arterial hypertension. 3. The lungs show mosaic attenuation, likely indicative of small airways obstructive disease and atelectasis. No frank consolidation. 4. 8 mm nodular opacity along the periphery of the minor fissure at the anterior most aspect of the superior segment of the right lower lobe. Non-contrast chest CT at 6-12 months is recommended.  If the nodule is stable at time of repeat CT, then future CT at 18-24 months (from today's scan) is considered optional for low-risk patients, but is recommended for high-risk patients. This recommendation follows the consensus statement: Guidelines for Management of Incidental Pulmonary Nodules Detected on CT Images: From the Fleischner Society 2017; Radiology 2017; 284:228-243. 5.  No evident  thoracic adenopathy. Critical Value/emergent results were called by telephone at the time of interpretation on 09/20/2017 at 7:45 am to Dr. Duffy Bruce , who verbally acknowledged these results. Electronically Signed   By: Lowella Grip III M.D.   On: 09/20/2017 07:46       Subjective: Patient seen and examined the bedside this morning.  He was comfortable.  No new issues/events.  Stable for discharge home today.  Discharge Exam: Vitals:   09/21/17 2342 09/22/17 0815  BP: 103/75 (!) 134/93  Pulse: 64 94  Resp: 16 18  Temp: 97.8 F (36.6 C) 99 F (37.2 C)  SpO2: 96% 95%   Vitals:   09/21/17 0829 09/21/17 1646 09/21/17 2342 09/22/17 0815  BP: 136/80 133/84 103/75 (!) 134/93  Pulse: 70 69 64 94  Resp: (!) 24 18 16 18   Temp: 98.1 F (36.7 C) 98.6 F (37 C) 97.8 F (36.6 C) 99 F (37.2 C)  TempSrc: Oral Oral Oral Oral  SpO2: 93% 94% 96% 95%  Weight:      Height:        General: Pt is alert, awake, not in acute distress Cardiovascular: RRR, S1/S2 +, no rubs, no gallops Respiratory: CTA bilaterally, no wheezing, no rhonchi Abdominal: Soft, NT, ND, bowel sounds + Extremities: mild edema right lower extremity, no cyanosis    The results of significant diagnostics from this hospitalization (including imaging, microbiology, ancillary and laboratory) are listed below for reference.     Microbiology: No results found for this or any previous visit (from the past 240 hour(s)).   Labs: BNP (last 3 results) Recent Labs    09/20/17 0610  BNP 13.2   Basic Metabolic Panel: Recent Labs  Lab 09/20/17 0456 09/20/17 0610 09/21/17 0606  NA 138  --  141  K 4.0  --  4.3  CL 105  --  104  CO2 26  --  28  GLUCOSE 125*  --  102*  BUN 15  --  10  CREATININE 1.13  --  1.15  CALCIUM 9.7  --  9.4  MG  --  2.0  --    Liver Function Tests: No results for input(s): AST, ALT, ALKPHOS, BILITOT, PROT, ALBUMIN in the last 168 hours. No results for input(s): LIPASE, AMYLASE in  the last 168 hours. No results for input(s): AMMONIA in the last 168 hours. CBC: Recent Labs  Lab 09/20/17 0456 09/21/17 0606 09/22/17 0236  WBC 7.8 6.7 6.6  HGB 16.7 15.6 15.6  HCT 51.3 47.9 47.8  MCV 81.3 80.8 80.3  PLT 230 191 201   Cardiac Enzymes: Recent Labs  Lab 09/20/17 0610 09/20/17 0818 09/20/17 1822 09/20/17 2312 09/21/17 0606  TROPONINI 0.03* <0.03 <0.03 <0.03 <0.03   BNP: Invalid input(s): POCBNP CBG: No results for input(s): GLUCAP in the last 168 hours. D-Dimer No results for input(s): DDIMER in the last 72 hours. Hgb A1c No results for input(s): HGBA1C in the last 72 hours. Lipid Profile No results for input(s): CHOL, HDL, LDLCALC, TRIG, CHOLHDL, LDLDIRECT in the last 72 hours. Thyroid function studies No results for input(s): TSH, T4TOTAL, T3FREE, THYROIDAB in the last  72 hours.  Invalid input(s): FREET3 Anemia work up No results for input(s): VITAMINB12, FOLATE, FERRITIN, TIBC, IRON, RETICCTPCT in the last 72 hours. Urinalysis No results found for: COLORURINE, APPEARANCEUR, LABSPEC, [place], GLUCOSEU, HGBUR, BILIRUBINUR, KETONESUR, PROTEINUR, UROBILINOGEN, NITRITE, LEUKOCYTESUR Sepsis Labs Invalid input(s): PROCALCITONIN,  WBC,  LACTICIDVEN Microbiology No results found for this or any previous visit (from the past 240 hour(s)).  Please note: You were cared for by a hospitalist during your hospital stay. Once you are discharged, your primary care physician will handle any further medical issues. Please note that NO REFILLS for any discharge medications will be authorized once you are discharged, as it is imperative that you return to your primary care physician (or establish a relationship with a primary care physician if you do not have one) for your post hospital discharge needs so that they can reassess your need for medications and monitor your lab values.    Time coordinating discharge: 40 minutes  SIGNED:   Shelly Coss, MD  [facility] 09/22/2017, 10:21 AM Pager [phone]  If 7PM-7AM, please contact night-coverage [email or web address] Password [email or web address]

## 2017-09-26 ENCOUNTER — Encounter: Payer: Self-pay | Admitting: *Deleted

## 2017-09-26 ENCOUNTER — Other Ambulatory Visit: Payer: Self-pay

## 2017-09-26 ENCOUNTER — Inpatient Hospital Stay: Payer: 59 | Attending: Hematology & Oncology | Admitting: Hematology & Oncology

## 2017-09-26 ENCOUNTER — Inpatient Hospital Stay: Payer: 59

## 2017-09-26 VITALS — BP 126/71 | HR 70 | Temp 97.8°F | Resp 18 | Wt 270.8 lb

## 2017-09-26 DIAGNOSIS — I2699 Other pulmonary embolism without acute cor pulmonale: Secondary | ICD-10-CM

## 2017-09-26 DIAGNOSIS — I2602 Saddle embolus of pulmonary artery with acute cor pulmonale: Secondary | ICD-10-CM

## 2017-09-26 DIAGNOSIS — Z7901 Long term (current) use of anticoagulants: Secondary | ICD-10-CM | POA: Insufficient documentation

## 2017-09-26 DIAGNOSIS — I82411 Acute embolism and thrombosis of right femoral vein: Secondary | ICD-10-CM | POA: Diagnosis not present

## 2017-09-26 DIAGNOSIS — I4891 Unspecified atrial fibrillation: Secondary | ICD-10-CM | POA: Diagnosis not present

## 2017-09-26 DIAGNOSIS — D6862 Lupus anticoagulant syndrome: Secondary | ICD-10-CM | POA: Diagnosis not present

## 2017-09-26 DIAGNOSIS — I82431 Acute embolism and thrombosis of right popliteal vein: Secondary | ICD-10-CM | POA: Insufficient documentation

## 2017-09-26 DIAGNOSIS — I2782 Chronic pulmonary embolism: Secondary | ICD-10-CM

## 2017-09-26 LAB — CBC WITH DIFFERENTIAL (CANCER CENTER ONLY)
Basophils Absolute: 0 10*3/uL (ref 0.0–0.1)
Basophils Relative: 0 %
EOS PCT: 2 %
Eosinophils Absolute: 0.2 10*3/uL (ref 0.0–0.5)
HEMATOCRIT: 45.5 % (ref 38.7–49.9)
Hemoglobin: 15.5 g/dL (ref 13.0–17.1)
LYMPHS ABS: 1.6 10*3/uL (ref 0.9–3.3)
Lymphocytes Relative: 24 %
MCH: 27.1 pg — AB (ref 28.0–33.4)
MCHC: 34.1 g/dL (ref 32.0–35.9)
MCV: 79.5 fL — AB (ref 82.0–98.0)
Monocytes Absolute: 0.6 10*3/uL (ref 0.1–0.9)
Monocytes Relative: 9 %
NEUTROS PCT: 65 %
Neutro Abs: 4.3 10*3/uL (ref 1.5–6.5)
PLATELETS: 219 10*3/uL (ref 145–400)
RBC: 5.72 MIL/uL — ABNORMAL HIGH (ref 4.20–5.70)
RDW: 14.4 % (ref 11.1–15.7)
WBC: 6.6 10*3/uL (ref 4.0–10.0)

## 2017-09-26 LAB — CMP (CANCER CENTER ONLY)
ALT: 36 U/L (ref 10–47)
ANION GAP: 15 (ref 5–15)
AST: 23 U/L (ref 11–38)
Albumin: 4.3 g/dL (ref 3.5–5.0)
Alkaline Phosphatase: 67 U/L (ref 26–84)
BILIRUBIN TOTAL: 1.1 mg/dL (ref 0.2–1.6)
BUN: 16 mg/dL (ref 7–22)
CHLORIDE: 103 mmol/L (ref 98–108)
CO2: 28 mmol/L (ref 18–33)
Calcium: 9.5 mg/dL (ref 8.0–10.3)
Creatinine: 1.4 mg/dL — ABNORMAL HIGH (ref 0.60–1.20)
GLUCOSE: 98 mg/dL (ref 73–118)
POTASSIUM: 4.3 mmol/L (ref 3.3–4.7)
Sodium: 146 mmol/L — ABNORMAL HIGH (ref 128–145)
Total Protein: 7.3 g/dL (ref 6.4–8.1)

## 2017-09-26 NOTE — Progress Notes (Signed)
Hematology and Oncology Follow Up Visit  Lee Phillips [id number] 1960-10-12 57 y.o. 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 is back for for follow-up.  Unfortunately, he has had yet another blood clot.  He was hospitalized last week.  He woke up with palpitations.  He went to the emergency room.  He was found to have atrial fibrillation.  However, he was felt to have a new blood clot in his right lung.  He had a Doppler of his legs done.  He had a new thrombus in the right leg.  This extended from the femoral vein down to the posterior tibial vein.  He was started on heparin in the hospital.  I thought that we could try him on Pradaxa.  Pradaxa is a direct thrombin inhibitor so this has a different mechanism of action than Xarelto and Eliquis.  He is doing okay.  He wants to go back to work.  He has a compression stocking on his right leg.  He says the leg is feeling better and is less swollen.  I am just very disappointed that he has had another thromboembolic event.  He has had no bleeding.  He has had no change in bowel or bladder habits.  He has had no rashes.  He said no mouth sores.  He does have occasional headaches.  Overall, his performance status is ECOG 1.    Medications:  Current Outpatient Medications:  .  aspirin 81 MG chewable tablet, Chew 2 tablets (162 mg total) by mouth daily., Disp: 30 tablet, Rfl: 0 .  dabigatran (PRADAXA) 150 MG CAPS capsule, Take 1 capsule (150 mg total) by mouth every 12 (twelve) hours., Disp: 60 capsule, Rfl: 0 .  lisinopril-hydrochlorothiazide (PRINZIDE,ZESTORETIC) 20-12.5 MG per tablet, Take 1 tablet by mouth daily., Disp: , Rfl:  .  metoprolol (LOPRESSOR) 25 MG tablet, Take 1 tablet (25 mg  total) by mouth 2 (two) times daily., Disp: 60 tablet, Rfl: 0 .  Multiple Vitamins-Minerals (MULTI-VITAMIN GUMMIES PO), Take 2 tablets by mouth daily., Disp: , Rfl:   Allergies:  No Active Allergies  Past Medical History, Surgical history, Social history, and Family History were reviewed and updated.  Review of Systems: Review of Systems  Constitutional: Negative.   HENT: Negative.   Eyes: Negative.   Respiratory: Negative.   Cardiovascular: Negative.   Gastrointestinal: Negative.   Genitourinary: Negative.   Musculoskeletal: Negative.   Skin: Negative.   Neurological: Negative.   Endo/Heme/Allergies: Negative.   Psychiatric/Behavioral: Negative.      Physical Exam:  weight is 270 lb 12 oz (122.8 kg). His oral temperature is 97.8 F (36.6 C). His blood pressure is 126/71 and his pulse is 70. His respiration is 18 and oxygen saturation is 97%.   Physical Exam  Constitutional: He is oriented to person, place, and time.  HENT:  Head: Normocephalic and atraumatic.  Mouth/Throat: Oropharynx is clear and moist.  Eyes: Pupils are equal, round, and reactive to light. EOM are normal.  Neck: Normal range of motion.  Cardiovascular: Normal rate, regular rhythm and normal heart sounds.  Pulmonary/Chest: Effort normal and breath sounds normal.  Abdominal: Soft. Bowel sounds are normal.  Musculoskeletal: Normal range of motion. He exhibits no  edema, tenderness or deformity.  Lymphadenopathy:    He has no cervical adenopathy.  Neurological: He is alert and oriented to person, place, and time.  Skin: Skin is warm and dry. No rash noted. No erythema.  Psychiatric: He has a normal mood and affect. His behavior is normal. Judgment and thought content normal.  Vitals reviewed.    Lab Results  Component Value Date   WBC 6.6 09/26/2017   HGB 15.5 09/26/2017   HCT 45.5 09/26/2017   MCV 79.5 (L) 09/26/2017   PLT 219 09/26/2017     Chemistry      Component Value Date/Time   NA 146  (H) 09/26/2017 1449   NA 141 02/27/2017 0745   NA 138 02/24/2016 0753      Component Value Date/Time   CALCIUM 9.5 09/26/2017 1449   CALCIUM 9.5 02/27/2017 0745   CALCIUM 9.5 02/24/2016 0753         Impression and Plan: Lee Phillips is 57 year old gentleman. He is a lifelong anticoagulation.   We will see how he does with the Pradaxa now.  He will be on Pradaxa and aspirin.  He has that lupus anticoagulant which I think is clearly a factor.  I would not do another CT angiogram or Doppler of his right leg for another couple months.  I would like to have him on therapeutic anticoagulation for a while and then repeat the studies.  Again, I think he go back to work.  He really likes work.  He works for the city of [place].  I will see him back in another 6 weeks.  Volanda Napoleon, MD 6/25/20196:14 PM

## 2017-09-28 LAB — PTT-LA MIX: PTT-LA MIX: 111.8 s — AB (ref 0.0–48.9)

## 2017-09-28 LAB — LUPUS ANTICOAGULANT PANEL
DRVVT: >180 s — ABNORMAL HIGH (ref 0.0–47.0)
PTT Lupus Anticoagulant: >160 s — ABNORMAL HIGH (ref 0.0–51.9)

## 2017-09-28 LAB — HEXAGONAL PHASE PHOSPHOLIPID: HEXAGONAL PHASE PHOSPHOLIPID: 79 s — AB (ref 0–11)

## 2017-09-28 LAB — DRVVT MIX: DRVVT MIX: 74.8 s — AB (ref 0.0–47.0)

## 2017-09-28 LAB — DRVVT CONFIRM

## 2017-09-29 ENCOUNTER — Other Ambulatory Visit: Payer: Self-pay

## 2017-09-29 MED ORDER — DABIGATRAN ETEXILATE MESYLATE 150 MG PO CAPS: 150.0000 mg | ORAL_CAPSULE | Freq: Two times a day (BID) | ORAL | 0 refills | Status: DC

## 2017-10-31 ENCOUNTER — Ambulatory Visit: Payer: 59 | Admitting: Hematology & Oncology

## 2017-10-31 ENCOUNTER — Other Ambulatory Visit: Payer: 59

## 2017-11-06 ENCOUNTER — Other Ambulatory Visit: Payer: Self-pay

## 2017-11-06 ENCOUNTER — Inpatient Hospital Stay (HOSPITAL_BASED_OUTPATIENT_CLINIC_OR_DEPARTMENT_OTHER): Payer: 59 | Admitting: Hematology & Oncology

## 2017-11-06 ENCOUNTER — Inpatient Hospital Stay: Payer: 59 | Attending: Hematology & Oncology

## 2017-11-06 ENCOUNTER — Encounter: Payer: Self-pay | Admitting: Hematology & Oncology

## 2017-11-06 VITALS — BP 124/77 | HR 58 | Temp 98.1°F | Resp 16 | Wt 271.0 lb

## 2017-11-06 DIAGNOSIS — I2699 Other pulmonary embolism without acute cor pulmonale: Secondary | ICD-10-CM

## 2017-11-06 DIAGNOSIS — D6862 Lupus anticoagulant syndrome: Secondary | ICD-10-CM | POA: Insufficient documentation

## 2017-11-06 DIAGNOSIS — I87009 Postthrombotic syndrome without complications of unspecified extremity: Secondary | ICD-10-CM | POA: Insufficient documentation

## 2017-11-06 DIAGNOSIS — Z7982 Long term (current) use of aspirin: Secondary | ICD-10-CM | POA: Diagnosis not present

## 2017-11-06 DIAGNOSIS — I4891 Unspecified atrial fibrillation: Secondary | ICD-10-CM | POA: Diagnosis not present

## 2017-11-06 DIAGNOSIS — Z7901 Long term (current) use of anticoagulants: Secondary | ICD-10-CM | POA: Diagnosis not present

## 2017-11-06 DIAGNOSIS — I82431 Acute embolism and thrombosis of right popliteal vein: Secondary | ICD-10-CM

## 2017-11-06 DIAGNOSIS — I82411 Acute embolism and thrombosis of right femoral vein: Secondary | ICD-10-CM | POA: Diagnosis not present

## 2017-11-06 LAB — CBC WITH DIFFERENTIAL (CANCER CENTER ONLY)
BASOS ABS: 0 10*3/uL (ref 0.0–0.1)
Basophils Relative: 0 %
EOS ABS: 0.3 10*3/uL (ref 0.0–0.5)
EOS PCT: 3 %
HCT: 47.1 % (ref 38.7–49.9)
Hemoglobin: 15.7 g/dL (ref 13.0–17.1)
LYMPHS PCT: 25 %
Lymphs Abs: 1.9 10*3/uL (ref 0.9–3.3)
MCH: 27 pg — ABNORMAL LOW (ref 28.0–33.4)
MCHC: 33.3 g/dL (ref 32.0–35.9)
MCV: 80.9 fL — ABNORMAL LOW (ref 82.0–98.0)
Monocytes Absolute: 0.6 10*3/uL (ref 0.1–0.9)
Monocytes Relative: 9 %
NEUTROS PCT: 63 %
Neutro Abs: 4.6 10*3/uL (ref 1.5–6.5)
PLATELETS: 212 10*3/uL (ref 145–400)
RBC: 5.82 MIL/uL — AB (ref 4.20–5.70)
RDW: 14.2 % (ref 11.1–15.7)
WBC: 7.4 10*3/uL (ref 4.0–10.0)

## 2017-11-06 LAB — CMP (CANCER CENTER ONLY)
ALT: 20 U/L (ref 0–44)
AST: 25 U/L (ref 15–41)
Albumin: 4.5 g/dL (ref 3.5–5.0)
Alkaline Phosphatase: 62 U/L (ref 38–126)
Anion gap: 12 (ref 5–15)
BUN: 15 mg/dL (ref 6–20)
CALCIUM: 9.9 mg/dL (ref 8.9–10.3)
CHLORIDE: 103 mmol/L (ref 98–111)
CO2: 25 mmol/L (ref 22–32)
Creatinine: 1.17 mg/dL (ref 0.61–1.24)
GFR, Est AFR Am: >60 mL/min (ref >60)
GFR, Estimated: >60 mL/min (ref >60)
GLUCOSE: 88 mg/dL (ref 70–99)
Potassium: 5 mmol/L (ref 3.5–5.1)
SODIUM: 140 mmol/L (ref 135–145)
Total Bilirubin: 0.9 mg/dL (ref 0.3–1.2)
Total Protein: 7.5 g/dL (ref 6.5–8.1)

## 2017-11-06 NOTE — Progress Notes (Signed)
Hematology and Oncology Follow Up Visit  Lee Phillips [id number] 07/25/1960 57 y.o. 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 is back for for follow-up.  Unfortunately, he has had yet another blood clot.  He was hospitalized last week.  He woke up with palpitations.  He went to the emergency room.  He was found to have atrial fibrillation.  However, he was felt to have a new blood clot in his right lung.  He had a Doppler of his legs done.  He had a new thrombus in the right leg.  This extended from the femoral vein down to the posterior tibial vein.  He was started on heparin in the hospital.  I thought that we could try him on Pradaxa.  Pradaxa is a direct thrombin inhibitor so this has a different mechanism of action than Xarelto and Eliquis.  He is doing okay.  He wants to go back to work.  He has a compression stocking on his right leg.  He says the leg is feeling better and is less swollen.  I am just very disappointed that he has had another thromboembolic event.  He has had no bleeding.  He has had no change in bowel or bladder habits.  He has had no rashes.  He said no mouth sores.  He does have occasional headaches.  Overall, his performance status is ECOG 1.    Medications:  Current Outpatient Medications:  .  aspirin 81 MG chewable tablet, Chew 2 tablets (162 mg total) by mouth daily., Disp: 30 tablet, Rfl: 0 .  dabigatran (PRADAXA) 150 MG CAPS capsule, Take 1 capsule (150 mg total) by mouth every 12 (twelve) hours. Second script sent. 1st had error in dispense number. Thank you., Disp: 180 capsule, Rfl: 0 .  lisinopril-hydrochlorothiazide (PRINZIDE,ZESTORETIC) 20-12.5 MG per tablet, Take 1 tablet by mouth daily., Disp: ,  Rfl:  .  metoprolol (LOPRESSOR) 25 MG tablet, Take 1 tablet (25 mg total) by mouth 2 (two) times daily., Disp: 60 tablet, Rfl: 0 .  Multiple Vitamins-Minerals (MULTI-VITAMIN GUMMIES PO), Take 2 tablets by mouth daily., Disp: , Rfl:   Allergies:  No Active Allergies  Past Medical History, Surgical history, Social history, and Family History were reviewed and updated.  Review of Systems: Review of Systems  Constitutional: Negative.   HENT: Negative.   Eyes: Negative.   Respiratory: Negative.   Cardiovascular: Negative.   Gastrointestinal: Negative.   Genitourinary: Negative.   Musculoskeletal: Negative.   Skin: Negative.   Neurological: Negative.   Endo/Heme/Allergies: Negative.   Psychiatric/Behavioral: Negative.      Physical Exam:  weight is 271 lb (122.9 kg). His oral temperature is 98.1 F (36.7 C). His blood pressure is 124/77 and his pulse is 58 (abnormal). His respiration is 16 and oxygen saturation is 98%.   Physical Exam  Constitutional: He is oriented to person, place, and time.  HENT:  Head: Normocephalic and atraumatic.  Mouth/Throat: Oropharynx is clear and moist.  Eyes: Pupils are equal, round, and reactive to light. EOM are normal.  Neck: Normal range of motion.  Cardiovascular: Normal rate, regular rhythm and normal heart sounds.  Pulmonary/Chest: Effort normal and breath sounds normal.  Abdominal: Soft. Bowel sounds are  normal.  Musculoskeletal: Normal range of motion. He exhibits no edema, tenderness or deformity.  Lymphadenopathy:    He has no cervical adenopathy.  Neurological: He is alert and oriented to person, place, and time.  Skin: Skin is warm and dry. No rash noted. No erythema.  Psychiatric: He has a normal mood and affect. His behavior is normal. Judgment and thought content normal.  Vitals reviewed.    Lab Results  Component Value Date   WBC 7.4 11/06/2017   HGB 15.7 11/06/2017   HCT 47.1 11/06/2017   MCV 80.9 (L) 11/06/2017   PLT  212 11/06/2017     Chemistry      Component Value Date/Time   NA 146 (H) 09/26/2017 1449   NA 141 02/27/2017 0745   NA 138 02/24/2016 0753      Component Value Date/Time   CALCIUM 9.5 09/26/2017 1449   CALCIUM 9.5 02/27/2017 0745   CALCIUM 9.5 02/24/2016 0753         Impression and Plan: Lee Phillips is 57 year old gentleman. He is a lifelong anticoagulation.   We will see how he does with the Pradaxa now.  He will be on Pradaxa and aspirin.  He has that lupus anticoagulant which I think is clearly a factor.  We will go ahead and plan to get the Doppler of his right leg and CT angiogram of his chest in 6 weeks.  By then, he will be on Pradaxa and aspirin for 3 months.  I am sure that the pulmonary embolism will be gone.  I am sure that he probably will have a chronic thrombus in the right lower leg.  Volanda Napoleon, MD 8/5/201912:35 PM

## 2017-12-08 ENCOUNTER — Other Ambulatory Visit: Payer: Self-pay | Admitting: Hematology & Oncology

## 2017-12-11 ENCOUNTER — Ambulatory Visit (HOSPITAL_BASED_OUTPATIENT_CLINIC_OR_DEPARTMENT_OTHER)
Admission: RE | Admit: 2017-12-11 | Discharge: 2017-12-11 | Disposition: A | Discharge status: Home / Self Care | Payer: 59 | Source: Ambulatory Visit | Attending: Hematology & Oncology | Admitting: Hematology & Oncology

## 2017-12-11 DIAGNOSIS — I288 Other diseases of pulmonary vessels: Secondary | ICD-10-CM | POA: Diagnosis not present

## 2017-12-11 DIAGNOSIS — D6862 Lupus anticoagulant syndrome: Secondary | ICD-10-CM

## 2017-12-11 DIAGNOSIS — I82431 Acute embolism and thrombosis of right popliteal vein: Secondary | ICD-10-CM | POA: Insufficient documentation

## 2017-12-11 DIAGNOSIS — R918 Other nonspecific abnormal finding of lung field: Secondary | ICD-10-CM | POA: Diagnosis not present

## 2017-12-11 DIAGNOSIS — I2782 Chronic pulmonary embolism: Secondary | ICD-10-CM | POA: Diagnosis not present

## 2017-12-11 DIAGNOSIS — I82411 Acute embolism and thrombosis of right femoral vein: Secondary | ICD-10-CM | POA: Diagnosis not present

## 2017-12-11 DIAGNOSIS — R911 Solitary pulmonary nodule: Secondary | ICD-10-CM | POA: Diagnosis not present

## 2017-12-11 MED ORDER — IOPAMIDOL (ISOVUE-370) INJECTION 76%
100.0000 mL | Freq: Once | INTRAVENOUS | Status: AC | PRN
  Administered 2017-12-11: 100 mL via INTRAVENOUS

## 2017-12-12 ENCOUNTER — Telehealth: Payer: Self-pay | Admitting: *Deleted

## 2017-12-12 NOTE — Telephone Encounter (Addendum)
Patient is aware of results. He knows to remain on blood thinner  ----- Message from Volanda Napoleon, MD sent at 12/11/2017  5:07 PM EDT ----- Call - there is a small non-occlusive clot in the right leg and a very tine blood clot on the lung.  Keep on blood thinner!!!  Laurey Arrow

## 2018-01-15 DIAGNOSIS — I4891 Unspecified atrial fibrillation: Secondary | ICD-10-CM | POA: Diagnosis not present

## 2018-01-15 DIAGNOSIS — Z86718 Personal history of other venous thrombosis and embolism: Secondary | ICD-10-CM | POA: Diagnosis not present

## 2018-01-15 DIAGNOSIS — I10 Essential (primary) hypertension: Secondary | ICD-10-CM | POA: Diagnosis not present

## 2018-01-15 DIAGNOSIS — Z23 Encounter for immunization: Secondary | ICD-10-CM | POA: Diagnosis not present

## 2018-02-12 ENCOUNTER — Other Ambulatory Visit: Payer: 59

## 2018-02-12 ENCOUNTER — Ambulatory Visit: Payer: 59 | Admitting: Family

## 2018-02-15 ENCOUNTER — Other Ambulatory Visit: Payer: Self-pay | Admitting: Hematology & Oncology

## 2018-03-19 ENCOUNTER — Telehealth: Payer: Self-pay | Admitting: Hematology & Oncology

## 2018-03-19 ENCOUNTER — Inpatient Hospital Stay: Payer: 59 | Admitting: Hematology & Oncology

## 2018-03-19 ENCOUNTER — Other Ambulatory Visit: Payer: Self-pay

## 2018-03-19 ENCOUNTER — Encounter: Payer: Self-pay | Admitting: Hematology & Oncology

## 2018-03-19 ENCOUNTER — Inpatient Hospital Stay: Payer: 59 | Attending: Hematology & Oncology

## 2018-03-19 VITALS — BP 139/81 | HR 72 | Temp 97.8°F | Resp 18 | Wt 277.8 lb

## 2018-03-19 DIAGNOSIS — Z86711 Personal history of pulmonary embolism: Secondary | ICD-10-CM

## 2018-03-19 DIAGNOSIS — Z86718 Personal history of other venous thrombosis and embolism: Secondary | ICD-10-CM | POA: Insufficient documentation

## 2018-03-19 DIAGNOSIS — D6862 Lupus anticoagulant syndrome: Secondary | ICD-10-CM | POA: Diagnosis not present

## 2018-03-19 DIAGNOSIS — Z7902 Long term (current) use of antithrombotics/antiplatelets: Secondary | ICD-10-CM

## 2018-03-19 DIAGNOSIS — Z7982 Long term (current) use of aspirin: Secondary | ICD-10-CM | POA: Diagnosis not present

## 2018-03-19 DIAGNOSIS — Z79899 Other long term (current) drug therapy: Secondary | ICD-10-CM

## 2018-03-19 LAB — CBC WITH DIFFERENTIAL (CANCER CENTER ONLY)
ABS IMMATURE GRANULOCYTES: 0.04 10*3/uL (ref 0.00–0.07)
Basophils Absolute: 0 10*3/uL (ref 0.0–0.1)
Basophils Relative: 1 %
Eosinophils Absolute: 0.3 10*3/uL (ref 0.0–0.5)
Eosinophils Relative: 4 %
HEMATOCRIT: 49.9 % (ref 39.0–52.0)
HEMOGLOBIN: 16.2 g/dL (ref 13.0–17.0)
Immature Granulocytes: 1 %
LYMPHS ABS: 1.6 10*3/uL (ref 0.7–4.0)
LYMPHS PCT: 22 %
MCH: 26.6 pg (ref 26.0–34.0)
MCHC: 32.5 g/dL (ref 30.0–36.0)
MCV: 82.1 fL (ref 80.0–100.0)
MONOS PCT: 8 %
Monocytes Absolute: 0.6 10*3/uL (ref 0.1–1.0)
NEUTROS ABS: 4.7 10*3/uL (ref 1.7–7.7)
NEUTROS PCT: 64 %
Platelet Count: 233 10*3/uL (ref 150–400)
RBC: 6.08 MIL/uL — ABNORMAL HIGH (ref 4.22–5.81)
RDW: 13.4 % (ref 11.5–15.5)
WBC Count: 7.1 10*3/uL (ref 4.0–10.5)
nRBC: 0 % (ref 0.0–0.2)

## 2018-03-19 LAB — CMP (CANCER CENTER ONLY)
ALBUMIN: 4.5 g/dL (ref 3.5–5.0)
ALK PHOS: 54 U/L (ref 38–126)
ALT: 17 U/L (ref 0–44)
AST: 14 U/L — AB (ref 15–41)
Anion gap: 7 (ref 5–15)
BILIRUBIN TOTAL: 0.8 mg/dL (ref 0.3–1.2)
BUN: 18 mg/dL (ref 6–20)
CO2: 30 mmol/L (ref 22–32)
CREATININE: 1.12 mg/dL (ref 0.61–1.24)
Calcium: 10.2 mg/dL (ref 8.9–10.3)
Chloride: 103 mmol/L (ref 98–111)
GFR, Est AFR Am: >60 mL/min (ref >60)
GLUCOSE: 104 mg/dL — AB (ref 70–99)
Potassium: 4.4 mmol/L (ref 3.5–5.1)
Sodium: 140 mmol/L (ref 135–145)
TOTAL PROTEIN: 6.7 g/dL (ref 6.5–8.1)

## 2018-03-19 NOTE — Telephone Encounter (Signed)
sched next appt per 12/16 LOS. appt letter mailed

## 2018-03-19 NOTE — Progress Notes (Signed)
Hematology and Oncology Follow Up Visit  GARETH FITZNER [id number] 10-11-1960 57 y.o. 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  Emig is back for for follow-up.  He is doing quite well.  He says his leg has never been better.  Maybe, the Pradaxa and aspirin combination is doing the trick for him.  He still has post phlebitic issues with his right leg.  However, he wears a compression stocking that really seems to help.  He has had no bleeding.  He has had no cough or chest wall pain.  He has had no fever.  There is been no change in bowel or bladder habits.  He is still working full-time.  He has had no rashes.  There is been no headache.    Overall, his performance status is ECOG 1.    Medications:  Current Outpatient Medications:  .  aspirin 81 MG chewable tablet, Chew 2 tablets (162 mg total) by mouth daily., Disp: 30 tablet, Rfl: 0 .  lisinopril-hydrochlorothiazide (PRINZIDE,ZESTORETIC) 20-12.5 MG per tablet, Take 1 tablet by mouth daily., Disp: , Rfl:  .  metoprolol (LOPRESSOR) 25 MG tablet, Take 1 tablet (25 mg total) by mouth 2 (two) times daily., Disp: 60 tablet, Rfl: 0 .  Multiple Vitamins-Minerals (MULTI-VITAMIN GUMMIES PO), Take 2 tablets by mouth daily., Disp: , Rfl:  .  PRADAXA 150 MG CAPS capsule, TAKE 1 CAPSULE BY MOUTH  EVERY 12 HOURS, Disp: 180 capsule, Rfl: 0  Allergies:  No Known Allergies  Past Medical History, Surgical history, Social history, and Family History were reviewed and updated.  Review of Systems: Review of Systems  Constitutional: Negative.   HENT: Negative.   Eyes: Negative.   Respiratory: Negative.   Cardiovascular: Negative.   Gastrointestinal: Negative.   Genitourinary: Negative.    Musculoskeletal: Negative.   Skin: Negative.   Neurological: Negative.   Endo/Heme/Allergies: Negative.   Psychiatric/Behavioral: Negative.      Physical Exam:  weight is 277 lb 12 oz (126 kg). His oral temperature is 97.8 F (36.6 C). His blood pressure is 139/81 and his pulse is 72. His respiration is 18 and oxygen saturation is 98%.   Physical Exam Vitals signs reviewed.  HENT:     Head: Normocephalic and atraumatic.  Eyes:     Pupils: Pupils are equal, round, and reactive to light.  Neck:     Musculoskeletal: Normal range of motion.  Cardiovascular:     Rate and Rhythm: Normal rate and regular rhythm.     Heart sounds: Normal heart sounds.  Pulmonary:     Effort: Pulmonary effort is normal.     Breath sounds: Normal breath sounds.  Abdominal:     General: Bowel sounds are normal.     Palpations: Abdomen is soft.  Musculoskeletal: Normal range of motion.        General: No tenderness or deformity.  Lymphadenopathy:     Cervical: No cervical adenopathy.  Skin:    General: Skin is warm and dry.     Findings: No erythema or rash.  Neurological:     Mental Status: He is alert and oriented to person, place, and time.  Psychiatric:        Behavior: Behavior normal.  Thought Content: Thought content normal.        Judgment: Judgment normal.      Lab Results  Component Value Date   WBC 7.1 03/19/2018   HGB 16.2 03/19/2018   HCT 49.9 03/19/2018   MCV 82.1 03/19/2018   PLT 233 03/19/2018     Chemistry      Component Value Date/Time   NA 140 03/19/2018 0808   NA 141 02/27/2017 0745   NA 138 02/24/2016 0753      Component Value Date/Time   CALCIUM 10.2 03/19/2018 0808   CALCIUM 9.5 02/27/2017 0745   CALCIUM 9.5 02/24/2016 0753         Impression and Plan: Mr. Phung is 57 year old gentleman. He is a lifelong anticoagulation.   Hopefully, we are seeing a response with Pradaxa.  He has the lupus anticoagulant.  As such, I think Pradaxa with  aspirin is a good combination for him.  I will plan to see him back in 6 months now.  I think this would be reasonable to do for [place].  I do not see that we have to do any Dopplers or angiograms on him.  He needs a lifelong anticoagulation so what we find on a Doppler of probably would not change our management unless he is symptomatic.   Volanda Napoleon, MD 12/16/20199:04 AM

## 2018-05-11 ENCOUNTER — Other Ambulatory Visit: Payer: Self-pay | Admitting: Hematology & Oncology

## 2018-08-05 ENCOUNTER — Other Ambulatory Visit: Payer: Self-pay | Admitting: Hematology & Oncology

## 2018-09-18 ENCOUNTER — Other Ambulatory Visit: Payer: 59

## 2018-09-18 ENCOUNTER — Ambulatory Visit: Payer: 59 | Admitting: Hematology & Oncology

## 2018-09-30 IMAGING — CT CT ANGIO CHEST
2 of 8 series · 18 of 36 positions shown · IV contrast (iopamidol)
Comparison: 09/20/2017

CLINICAL DATA: History of PE and DVT.  Assessing treatment.

EXAM:
CT ANGIOGRAPHY CHEST WITH CONTRAST
TECHNIQUE: Multidetector CT imaging of the chest was performed using the
standard protocol during bolus administration of intravenous
contrast. Multiplanar CT image reconstructions and MIPs were
obtained to evaluate the vascular anatomy.
CONTRAST:  100mL R8DBO9-XEA IOPAMIDOL (R8DBO9-XEA) INJECTION 76%

[Series 6: pe thins · axial · 0.92mm/px · z∈[-366,-44]mm · 17 of 360 slices shown]
[im 19/360  lung]
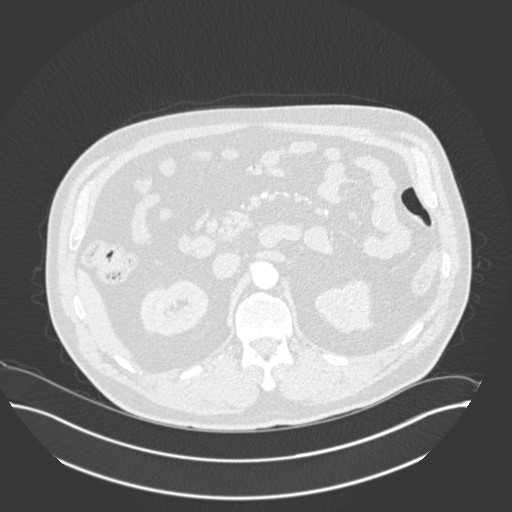
[im 38/360  mediastinal]
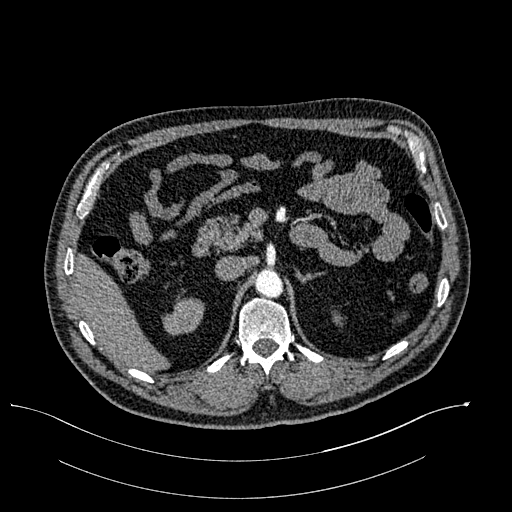
[im 57/360  lung]
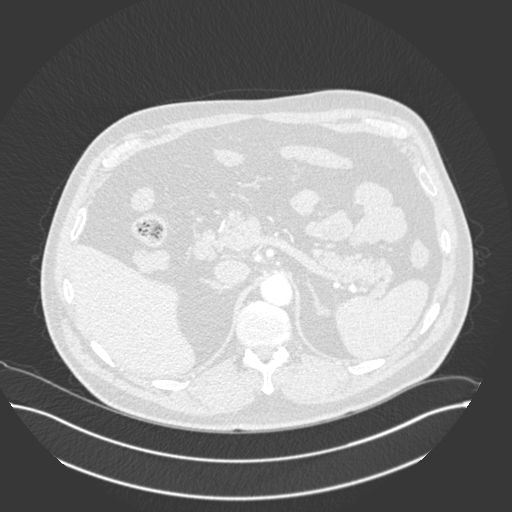
[im 76/360  mediastinal]
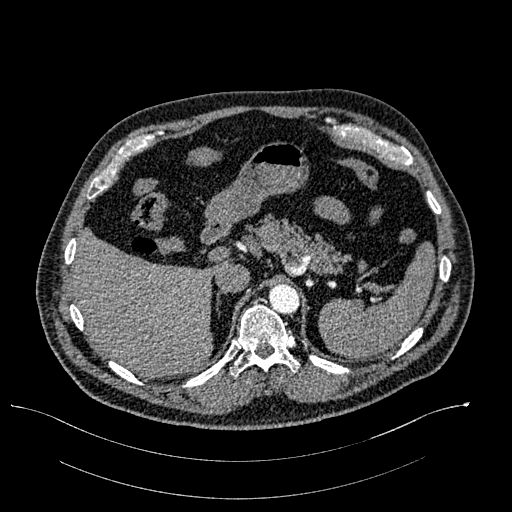
[im 95/360  lung]
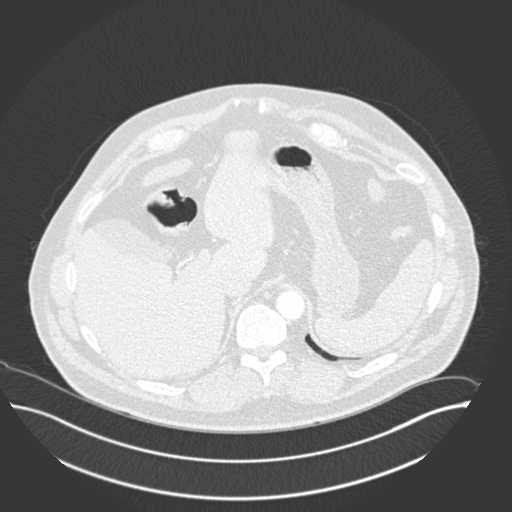
[im 114/360  mediastinal]
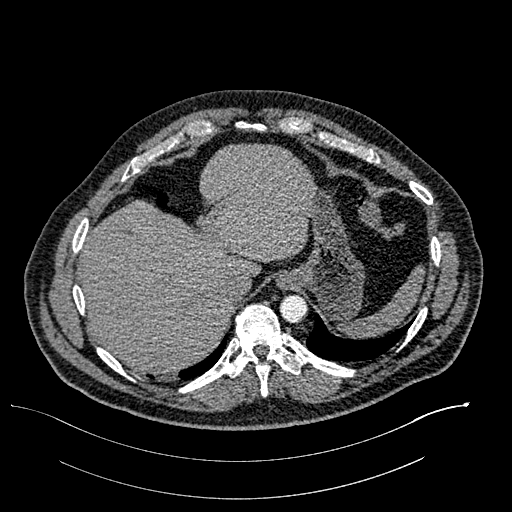
[im 133/360  lung]
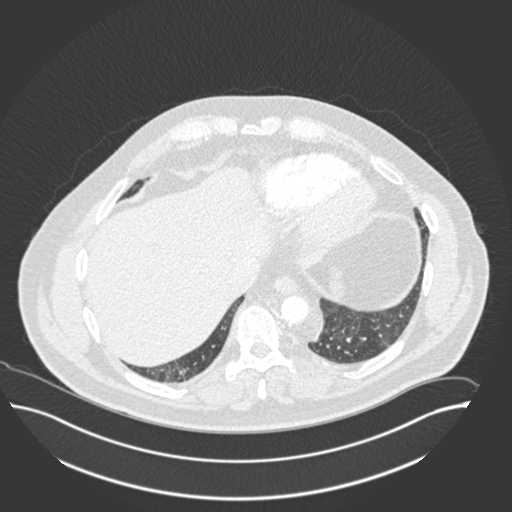
[im 152/360  mediastinal]
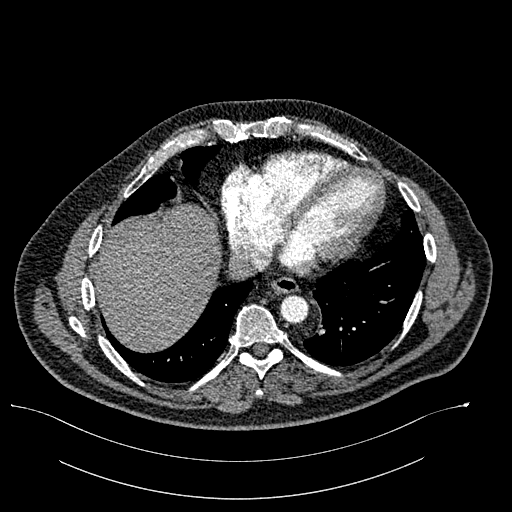
[im 189/360  lung]
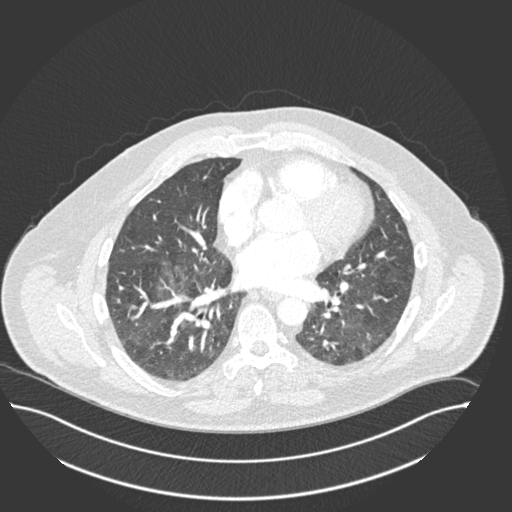
[im 208/360  mediastinal]
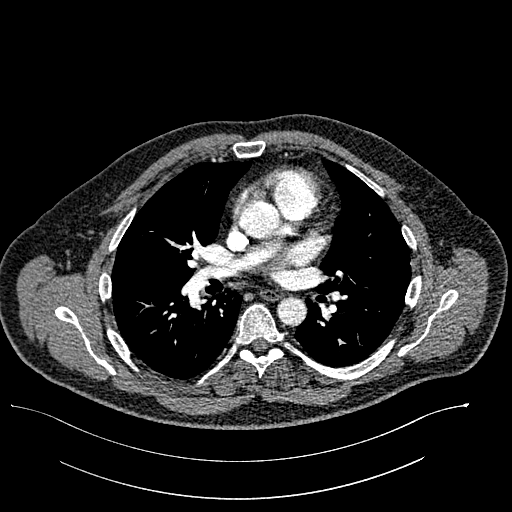
[im 227/360  lung]
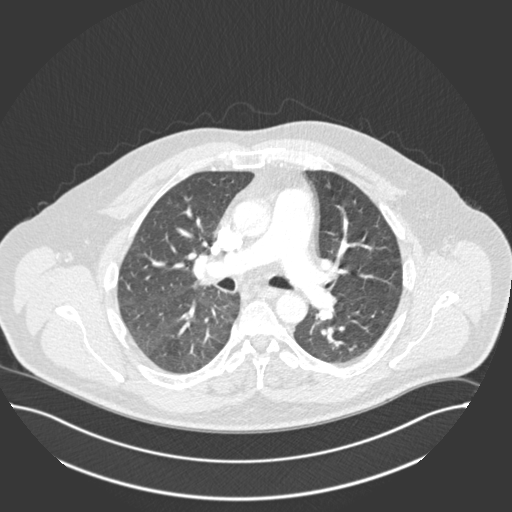
[im 246/360  mediastinal]
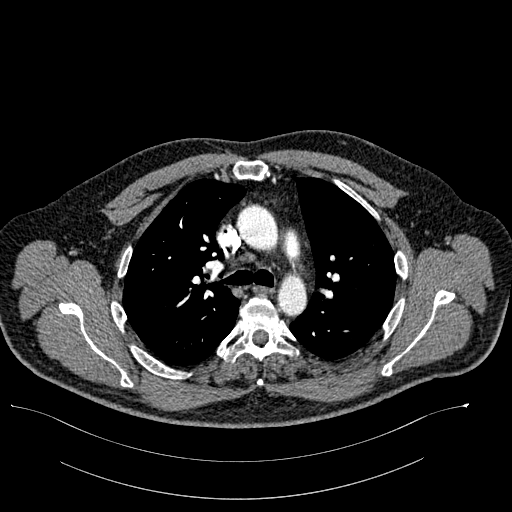
[im 265/360  lung]
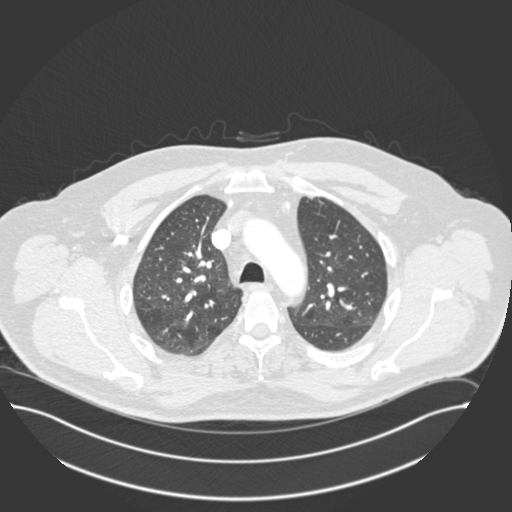
[im 284/360  mediastinal]
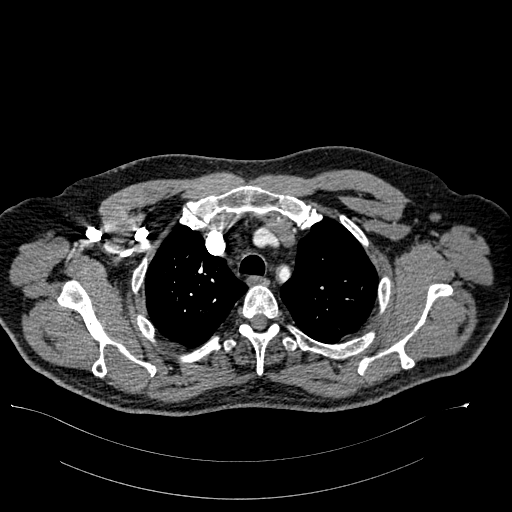
[im 303/360  lung]
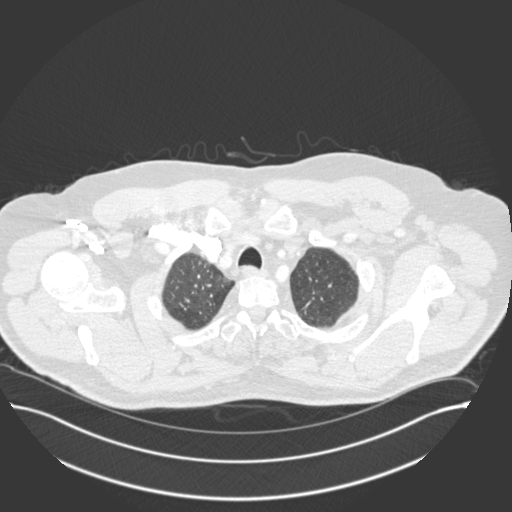
[im 322/360  mediastinal]
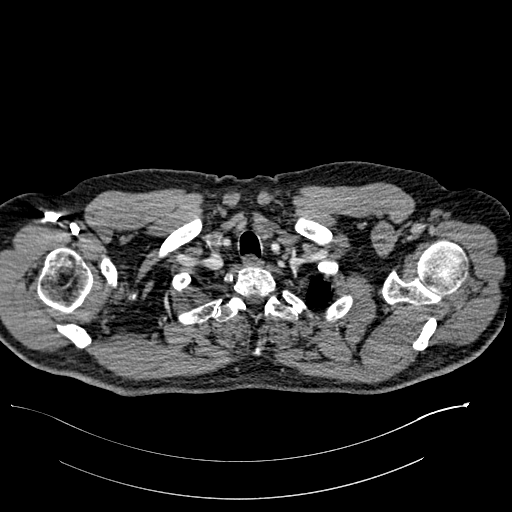
[im 341/360  lung]
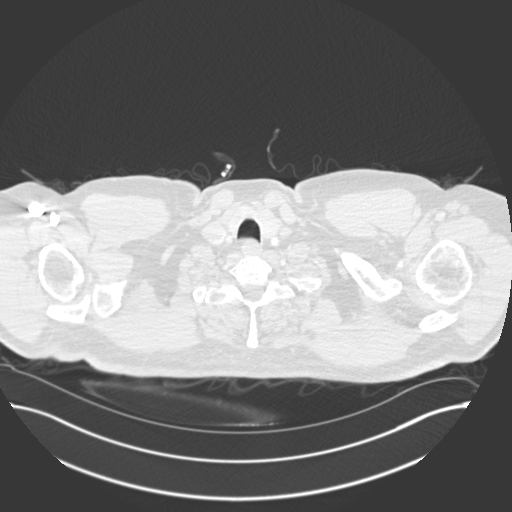

[Series 7: pe coronal mpr · coronal · 0.72mm/px · 1 of 156 slices shown]
[im 78/156  mediastinal]
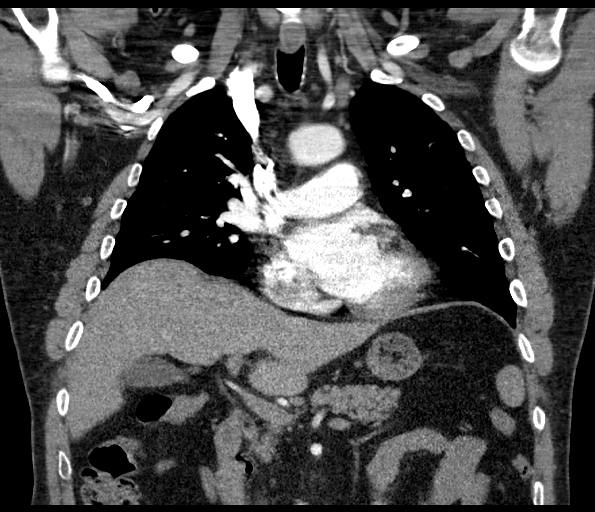

[18 of 36 positions shown; findings below may reference images not displayed]

FINDINGS: Cardiovascular: Satisfactory opacification of the pulmonary arteries
to the segmental level. Small residual linear pulmonary embolus in a
segmental right lower lobe pulmonary branch. Normal heart size. No
pericardial effusion.

Mediastinum/Nodes: Mild mediastinal lymphadenopathy with an index
lymph node in the precarinal station measuring 12 mm in short axis.
Normal appearance of the esophagus and trachea. Normal thyroid
gland.

Lungs/Pleura: Right middle lobe atelectatic changes. Subpleural soft
tissue pulmonary nodule in the superior segment of the right lower
lobe measures 6.7 mm, image 54/120, sequence 5. Mild mosaic
attenuation of the lung parenchyma.

Upper Abdomen: No acute abnormality.

Musculoskeletal: No chest wall abnormality. No acute or significant
osseous findings.

Review of the MIP images confirms the above findings.
IMPRESSION: Small residual chronic nonocclusive pulmonary embolus in the
segmental branch of the right lower lobe.

Persistent enlargement of the main pulmonary trunk, usually
associated with pulmonary arterial hypertension.

Mild mosaic attenuation of the lungs, indicative of small airway or
small vessel disease.

7 mm perifissural subpleural pulmonary nodule in the superior
segment of the right upper lobe, stable from the most recent study.
Non-contrast chest CT at 6-12 months is recommended. If the nodule
is stable at time of repeat CT, then future CT at 18-24 months (from
today's scan) is considered optional for low-risk patients, but is
recommended for high-risk patients. This recommendation follows the
consensus statement: Guidelines for Management of Incidental
Pulmonary Nodules Detected on CT Images: From the [HOSPITAL]

## 2018-10-01 ENCOUNTER — Inpatient Hospital Stay (HOSPITAL_BASED_OUTPATIENT_CLINIC_OR_DEPARTMENT_OTHER): Payer: 59 | Admitting: Hematology & Oncology

## 2018-10-01 ENCOUNTER — Inpatient Hospital Stay: Payer: 59 | Attending: Hematology & Oncology

## 2018-10-01 ENCOUNTER — Encounter: Payer: Self-pay | Admitting: Hematology & Oncology

## 2018-10-01 ENCOUNTER — Telehealth: Payer: Self-pay | Admitting: Hematology & Oncology

## 2018-10-01 ENCOUNTER — Other Ambulatory Visit: Payer: Self-pay

## 2018-10-01 VITALS — BP 145/94 | HR 67 | Temp 98.6°F | Resp 16 | Wt 275.0 lb

## 2018-10-01 DIAGNOSIS — I82431 Acute embolism and thrombosis of right popliteal vein: Secondary | ICD-10-CM

## 2018-10-01 DIAGNOSIS — D6862 Lupus anticoagulant syndrome: Secondary | ICD-10-CM | POA: Diagnosis not present

## 2018-10-01 DIAGNOSIS — I87001 Postthrombotic syndrome without complications of right lower extremity: Secondary | ICD-10-CM | POA: Diagnosis not present

## 2018-10-01 DIAGNOSIS — Z7982 Long term (current) use of aspirin: Secondary | ICD-10-CM | POA: Insufficient documentation

## 2018-10-01 DIAGNOSIS — Z7901 Long term (current) use of anticoagulants: Secondary | ICD-10-CM

## 2018-10-01 DIAGNOSIS — I82411 Acute embolism and thrombosis of right femoral vein: Secondary | ICD-10-CM | POA: Insufficient documentation

## 2018-10-01 LAB — CMP (CANCER CENTER ONLY)
ALT: 21 U/L (ref 0–44)
AST: 16 U/L (ref 15–41)
Albumin: 4.4 g/dL (ref 3.5–5.0)
Alkaline Phosphatase: 53 U/L (ref 38–126)
Anion gap: 9 (ref 5–15)
BUN: 15 mg/dL (ref 6–20)
CO2: 24 mmol/L (ref 22–32)
Calcium: 9.7 mg/dL (ref 8.9–10.3)
Chloride: 103 mmol/L (ref 98–111)
Creatinine: 1.09 mg/dL (ref 0.61–1.24)
GFR, Est AFR Am: >60 mL/min (ref >60)
GFR, Estimated: >60 mL/min (ref >60)
Glucose, Bld: 147 mg/dL — ABNORMAL HIGH (ref 70–99)
Potassium: 3.8 mmol/L (ref 3.5–5.1)
Sodium: 136 mmol/L (ref 135–145)
Total Bilirubin: 0.9 mg/dL (ref 0.3–1.2)
Total Protein: 7 g/dL (ref 6.5–8.1)

## 2018-10-01 LAB — CBC WITH DIFFERENTIAL (CANCER CENTER ONLY)
Abs Immature Granulocytes: 0.03 10*3/uL (ref 0.00–0.07)
Basophils Absolute: 0 10*3/uL (ref 0.0–0.1)
Basophils Relative: 1 %
Eosinophils Absolute: 0.3 10*3/uL (ref 0.0–0.5)
Eosinophils Relative: 4 %
HCT: 48.8 % (ref 39.0–52.0)
Hemoglobin: 16.2 g/dL (ref 13.0–17.0)
Immature Granulocytes: 1 %
Lymphocytes Relative: 22 %
Lymphs Abs: 1.5 10*3/uL (ref 0.7–4.0)
MCH: 27.1 pg (ref 26.0–34.0)
MCHC: 33.2 g/dL (ref 30.0–36.0)
MCV: 81.7 fL (ref 80.0–100.0)
Monocytes Absolute: 0.5 10*3/uL (ref 0.1–1.0)
Monocytes Relative: 7 %
Neutro Abs: 4.4 10*3/uL (ref 1.7–7.7)
Neutrophils Relative %: 65 %
Platelet Count: 200 10*3/uL (ref 150–400)
RBC: 5.97 MIL/uL — ABNORMAL HIGH (ref 4.22–5.81)
RDW: 13.4 % (ref 11.5–15.5)
WBC Count: 6.6 10*3/uL (ref 4.0–10.5)
nRBC: 0 % (ref 0.0–0.2)

## 2018-10-01 LAB — D-DIMER, QUANTITATIVE: D-Dimer, Quant: <0.27 ug/mL-FEU (ref 0.00–0.50)

## 2018-10-01 NOTE — Progress Notes (Signed)
Hematology and Oncology Follow Up Visit  NICKOLAOS BRALLIER [id number] 11-Jul-1960 58 y.o. 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  Hoyt is back for for follow-up.  Overall, he is doing quite well.  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  His right leg is doing well.  He is on Pradaxa and aspirin.  His would not complaining much in the way of pain.  He is never had a colonoscopy.  He clearly needs a colonoscopy.  I will have to see if 1 of the guys in our office can do a colonoscopy on him.  I told him that he would just have to be off the Pradaxa 2 days before the colonoscopy.  He has had no bleeding.  There is been no nausea or vomiting.  He has had no fever.  Has had no cough.  There is been no chest wall pain.  His appetite is doing pretty well.    Overall, his performance status is ECOG 1.    Medications:  Current Outpatient Medications:  .  aspirin 81 MG chewable tablet, Chew 2 tablets (162 mg total) by mouth daily., Disp: 30 tablet, Rfl: 0 .  lisinopril-hydrochlorothiazide (PRINZIDE,ZESTORETIC) 20-12.5 MG per tablet, Take 1 tablet by mouth daily., Disp: , Rfl:  .  metoprolol (LOPRESSOR) 25 MG tablet, Take 1 tablet (25 mg total) by mouth 2 (two) times daily., Disp: 60 tablet, Rfl: 0 .  Multiple Vitamins-Minerals (MULTI-VITAMIN GUMMIES PO), Take 2 tablets by mouth daily., Disp: , Rfl:  .  PRADAXA 150 MG CAPS capsule, TAKE 1 CAPSULE BY MOUTH  EVERY 12 HOURS, Disp: 180 capsule, Rfl: 0  Allergies:  No Known Allergies  Past Medical History, Surgical history, Social history, and Family History were reviewed and  updated.  Review of Systems: Review of Systems  Constitutional: Negative.   HENT: Negative.   Eyes: Negative.   Respiratory: Negative.   Cardiovascular: Negative.   Gastrointestinal: Negative.   Genitourinary: Negative.   Musculoskeletal: Negative.   Skin: Negative.   Neurological: Negative.   Endo/Heme/Allergies: Negative.   Psychiatric/Behavioral: Negative.      Physical Exam:  weight is 275 lb (124.7 kg). His oral temperature is 98.6 F (37 C). His blood pressure is 145/94 (abnormal) and his pulse is 67. His respiration is 16 and oxygen saturation is 99%.   Physical Exam Vitals signs reviewed.  HENT:     Head: Normocephalic and atraumatic.  Eyes:     Pupils: Pupils are equal, round, and reactive to light.  Neck:     Musculoskeletal: Normal range of motion.  Cardiovascular:     Rate and Rhythm: Normal rate and regular rhythm.     Heart sounds: Normal heart sounds.  Pulmonary:     Effort: Pulmonary effort is normal.     Breath sounds: Normal breath sounds.  Abdominal:     General: Bowel sounds are normal.     Palpations: Abdomen is soft.  Musculoskeletal: Normal range of motion.        General: No tenderness or deformity.  Lymphadenopathy:     Cervical: No  cervical adenopathy.  Skin:    General: Skin is warm and dry.     Findings: No erythema or rash.  Neurological:     Mental Status: He is alert and oriented to person, place, and time.  Psychiatric:        Behavior: Behavior normal.        Thought Content: Thought content normal.        Judgment: Judgment normal.      Lab Results  Component Value Date   WBC 6.6 10/01/2018   HGB 16.2 10/01/2018   HCT 48.8 10/01/2018   MCV 81.7 10/01/2018   PLT 200 10/01/2018     Chemistry      Component Value Date/Time   NA 140 03/19/2018 0808   NA 141 02/27/2017 0745   NA 138 02/24/2016 0753      Component Value Date/Time   CALCIUM 10.2 03/19/2018 0808   CALCIUM 9.5 02/27/2017 0745   CALCIUM 9.5 02/24/2016  0753         Impression and Plan: Mr. Langille is 58 year old gentleman. He is a lifelong anticoagulation.   Overall, I really think that is the right leg is doing well.  The post phlebitic issues really have been minimized which is nice.  I do think the colonoscopy is critically important for him.  We will plan to get back in 8 months now.  This is reasonable.  He is very stable right now.  Volanda Napoleon, MD 6/29/20208:24 AM

## 2018-10-01 NOTE — Telephone Encounter (Signed)
Per patient 6/29 he will call office to sch next appointments once he get his work schedule

## 2018-10-09 LAB — PTT-LA MIX: PTT-LA Mix: 106.4 s — ABNORMAL HIGH (ref 0.0–48.9)

## 2018-10-09 LAB — HEXAGONAL PHASE PHOSPHOLIPID: Hexagonal Phase Phospholipid: 110 s — ABNORMAL HIGH (ref 0–11)

## 2018-10-09 LAB — DRVVT MIX: dRVVT Mix: 110.4 s — ABNORMAL HIGH (ref 0.0–47.0)

## 2018-10-09 LAB — DRVVT CONFIRM: dRVVT Confirm: >2.4 ratio — ABNORMAL HIGH (ref 0.8–1.2)

## 2018-10-09 LAB — LUPUS ANTICOAGULANT PANEL
DRVVT: >180 s — ABNORMAL HIGH (ref 0.0–47.0)
PTT Lupus Anticoagulant: >160 s — ABNORMAL HIGH (ref 0.0–51.9)

## 2018-10-29 ENCOUNTER — Other Ambulatory Visit: Payer: Self-pay | Admitting: Hematology & Oncology

## 2019-03-05 HISTORY — PX: EYE SURGERY: SHX253

## 2019-05-06 ENCOUNTER — Encounter: Payer: Self-pay | Admitting: Gastroenterology

## 2019-05-13 ENCOUNTER — Encounter: Payer: Self-pay | Admitting: Gastroenterology

## 2019-05-13 ENCOUNTER — Other Ambulatory Visit: Payer: Self-pay

## 2019-05-13 ENCOUNTER — Telehealth: Payer: Self-pay

## 2019-05-13 ENCOUNTER — Ambulatory Visit (INDEPENDENT_AMBULATORY_CARE_PROVIDER_SITE_OTHER): Payer: 59 | Admitting: Gastroenterology

## 2019-05-13 VITALS — BP 140/84 | HR 70 | Temp 97.7°F | Ht 76.0 in | Wt 284.1 lb

## 2019-05-13 DIAGNOSIS — Z01818 Encounter for other preprocedural examination: Secondary | ICD-10-CM | POA: Diagnosis not present

## 2019-05-13 DIAGNOSIS — Z7901 Long term (current) use of anticoagulants: Secondary | ICD-10-CM | POA: Diagnosis not present

## 2019-05-13 DIAGNOSIS — Z800 Family history of malignant neoplasm of digestive organs: Secondary | ICD-10-CM | POA: Diagnosis not present

## 2019-05-13 DIAGNOSIS — Z8672 Personal history of thrombophlebitis: Secondary | ICD-10-CM

## 2019-05-13 DIAGNOSIS — Z1211 Encounter for screening for malignant neoplasm of colon: Secondary | ICD-10-CM | POA: Diagnosis not present

## 2019-05-13 DIAGNOSIS — D6862 Lupus anticoagulant syndrome: Secondary | ICD-10-CM

## 2019-05-13 DIAGNOSIS — I4819 Other persistent atrial fibrillation: Secondary | ICD-10-CM

## 2019-05-13 MED ORDER — CLENPIQ 10-3.5-12 MG-GM -GM/160ML PO SOLN: 1.0000 | Freq: Once | ORAL | 0 refills | Status: AC

## 2019-05-13 NOTE — Progress Notes (Signed)
Chief Complaint: Colon cancer screening, family history of colon cancer  Referring Provider:     Burney Gauze, MD   HPI:    ODIN CHADWICK is a 59 y.o. male with a history of HTN, nephrolithiasis, lupus, Afib, history of RLE DVT in 2008 and DVT/PE in 2016 (on ASA 81 mg and Pradaxa), [facility] for evaluation of CRC screening.  No previous colonoscopy.  FHx n/f father with CRC at age 27.  He otherwise denies hematochezia, melena, change in bowel habits, abdominal pain.  He would like to discuss perioperative management of systemic anticoagulation.  Most recent labs from 09/2018 reviewed: Normal CBC and CMP.  No recent abdominal imaging for review.  Past Medical History:  Diagnosis Date  . A-fib ([place])   . History of kidney stones   . Hypertension   . Lupus anticoagulant with hypercoagulable state ([place]) 05/16/2011  . Lymphedema   . Personal history of DVT (deep vein thrombosis)    and PE     Past Surgical History:  Procedure Laterality Date  . EYE SURGERY  03/2019   first week was right 12/7 and 12/28 was left   . NASAL SEPTUM SURGERY     Family History  Problem Relation Age of Onset  . Colon cancer Father 40  . Melanoma Mother 50  . Clotting disorder Neg Hx   . Lupus Neg Hx   . CAD Neg Hx   . Diabetes Mellitus II Neg Hx   . Esophageal cancer Neg Hx    Social History   Tobacco Use  . Smoking status: Former Smoker    Packs/day: 1.50    Years: 5.00    Pack years: 7.50    Types: Cigarettes    Start date: 05/22/1973    Quit date: 07/21/1978    Years since quitting: 40.8  . Smokeless tobacco: Never Used  . Tobacco comment: quit 35 years ago  Substance Use Topics  . Alcohol use: No    Alcohol/week: 0.0 standard drinks  . Drug use: No   Current Outpatient Medications  Medication Sig Dispense Refill  . aspirin 81 MG chewable tablet Chew 2 tablets (162 mg total) by mouth daily. 30 tablet 0  .  lisinopril-hydrochlorothiazide (PRINZIDE,ZESTORETIC) 20-12.5 MG per tablet Take 1 tablet by mouth daily.    . metoprolol (LOPRESSOR) 25 MG tablet Take 1 tablet (25 mg total) by mouth 2 (two) times daily. 60 tablet 0  . Multiple Vitamins-Minerals (MULTI-VITAMIN GUMMIES PO) Take 2 tablets by mouth daily.    Marland Kitchen PRADAXA 150 MG CAPS capsule TAKE 1 CAPSULE BY MOUTH  EVERY 12 HOURS 180 capsule 3   No current facility-administered medications for this visit.   No Known Allergies   Review of Systems: All systems reviewed and negative except where noted in HPI.     Physical Exam:    Wt Readings from Last 3 Encounters:  05/13/19 284 lb 2 oz (128.9 kg)  10/01/18 275 lb (124.7 kg)  03/19/18 277 lb 12 oz (126 kg)    BP 140/84   Pulse 70   Temp 97.7 F (36.5 C)   Ht 6\' 4"  (1.93 m)   Wt 284 lb 2 oz (128.9 kg)   BMI 34.58 kg/m  Constitutional:  Pleasant, in no acute distress. Psychiatric: Normal mood and affect. Behavior is normal. EENT: Pupils normal.  Conjunctivae are normal. No scleral icterus. Neck supple. No cervical LAD.  Cardiovascular: Normal rate, regular rhythm. No edema Pulmonary/chest: Effort normal and breath sounds normal. No wheezing, rales or rhonchi. Abdominal: Soft, nondistended, nontender. Bowel sounds active throughout. There are no masses palpable. No hepatomegaly. Neurological: Alert and oriented to person place and time. Skin: Skin is warm and dry. No rashes noted.   ASSESSMENT AND PLAN;   1) Family History of Colon cancer 2) CRC screening 3) History of DVT/PE 4) History of Atrial Fibrillation 5) Systemic anticoagulation  59 year old male presents for initial colon cancer screening.  Father with CRC diagnosed at age 87.  Otherwise without any active GI symptoms.  Had a long conversation with the patient regarding management of systemic anticoagulation in the perioperative period.  Given strong family history and no previous screening, recommend holding Pradaxa x2  days which would allow for polypectomy as needed.    -Schedule colonoscopy -Hold Pradaxa to days before procedure - will instruct when and how to resume after procedure.  We had a long conversation regarding the low but real risk of cardiovascular event such as heart attack, stroke, embolism, thrombosis or ischemia/infarct of other organs off Pradaxa explained and need to seek urgent help if this occurs. The patient consents to proceed. Will communicate by phone or EMR with patient's prescribing provider (Dr. Marin Olp) to confirm that holding Pradaxa is reasonable in this case  The indications, risks, and benefits of colonoscopy were explained to the patient in detail. Risks include but are not limited to bleeding, perforation, adverse reaction to medications, and cardiopulmonary compromise. Sequelae include but are not limited to the possibility of surgery, hospitalization, and mortality.  Discussed additional risks related to management of systemic anticoagulation in the perioperative period as outlined above.  The patient verbalized understanding and wished to proceed. All questions answered, referred to the scheduler and bowel prep ordered. Further recommendations pending results of the exam.    Lavena Bullion, DO, FACG  05/13/2019, 9:25 AM   Carol Ada, MD

## 2019-05-13 NOTE — Patient Instructions (Signed)
If you are age 61 or older, your body mass index should be between 23-30. Your Body mass index is 34.58 kg/m. If this is out of the aforementioned range listed, please consider follow up with your Primary Care Provider.  If you are age 90 or younger, your body mass index should be between 19-25. Your Body mass index is 34.58 kg/m. If this is out of the aformentioned range listed, please consider follow up with your Primary Care Provider.    You have been scheduled for a colonoscopy. Please follow written instructions given to you at your visit today.  Please pick up your prep supplies at the pharmacy within the next 1-3 days. If you use inhalers (even only as needed), please bring them with you on the day of your procedure. Your physician has requested that you go to www.startemmi.com and enter the access code given to you at your visit today. This web site gives a general overview about your procedure. However, you should still follow specific instructions given to you by our office regarding your preparation for the procedure.  It was a pleasure to see you today!  Vito Cirigliano, D.O.

## 2019-05-13 NOTE — Telephone Encounter (Signed)
[facility] Pre-operative Risk Assessment     Request for surgical clearance:     Endoscopy Procedure  What type of surgery is being performed?     Colonoscopy  When is this surgery scheduled?     05/21/19  What type of clearance is required ?   Pharmacy  Are there any medications that need to be held prior to surgery and how long? Pradaxa  Practice name and name of physician performing surgery?      [facility]  What is your office phone and fax number?      Phone- [phone]  [phone]  Anesthesia type (None, local, MAC, general) ?       MAC

## 2019-05-15 NOTE — Telephone Encounter (Signed)
Dr Antonieta Pert nurse Marzetta Board walked over to our office with the  request for surgical clearance letter. Dr Marin Olp cleared patient to hold pradaxa for 2 days prior to colonoscopy on 05/21/19. Patient notified and will hold medication 2 days prior to procedure.

## 2019-05-17 ENCOUNTER — Ambulatory Visit (INDEPENDENT_AMBULATORY_CARE_PROVIDER_SITE_OTHER): Payer: 59

## 2019-05-17 DIAGNOSIS — Z1159 Encounter for screening for other viral diseases: Secondary | ICD-10-CM

## 2019-05-18 LAB — SARS CORONAVIRUS 2 (TAT 6-24 HRS): SARS Coronavirus 2: NEGATIVE

## 2019-05-21 ENCOUNTER — Encounter: Payer: Self-pay | Admitting: Gastroenterology

## 2019-05-21 ENCOUNTER — Ambulatory Visit (AMBULATORY_SURGERY_CENTER): Payer: 59 | Admitting: Gastroenterology

## 2019-05-21 ENCOUNTER — Other Ambulatory Visit: Payer: Self-pay

## 2019-05-21 VITALS — BP 105/69 | HR 68 | Temp 96.8°F | Resp 16 | Ht 76.0 in | Wt 284.0 lb

## 2019-05-21 DIAGNOSIS — Z1211 Encounter for screening for malignant neoplasm of colon: Secondary | ICD-10-CM

## 2019-05-21 DIAGNOSIS — Z800 Family history of malignant neoplasm of digestive organs: Secondary | ICD-10-CM

## 2019-05-21 DIAGNOSIS — K635 Polyp of colon: Secondary | ICD-10-CM

## 2019-05-21 DIAGNOSIS — D122 Benign neoplasm of ascending colon: Secondary | ICD-10-CM

## 2019-05-21 DIAGNOSIS — K640 First degree hemorrhoids: Secondary | ICD-10-CM

## 2019-05-21 MED ORDER — SODIUM CHLORIDE 0.9 % IV SOLN: 500.0000 mL | Freq: Once | INTRAVENOUS | Status: AC

## 2019-05-21 NOTE — Progress Notes (Signed)
To PACU, vss. Report to [email or web address] 

## 2019-05-21 NOTE — Op Note (Signed)
[facility] Patient Name: Lee Phillips Procedure Date: 05/21/2019 9:11 AM MRN: [id number] Endoscopist: Gerrit Heck , MD Age: 58 Referring MD:  Date of Birth: [birth year] Gender: Male Account #: [id number] Procedure:                Colonoscopy Indications:              Screening in patient at increased risk: Colorectal                            cancer in father before age 40, This is the                            patient's first colonoscopy Medicines:                Monitored Anesthesia Care Procedure:                Pre-Anesthesia Assessment:                           - Prior to the procedure, a History and Physical                            was performed, and patient medications and                            allergies were reviewed. The patient's tolerance of                            previous anesthesia was also reviewed. The risks                            and benefits of the procedure and the sedation                            options and risks were discussed with the patient.                            All questions were answered, and informed consent                            was obtained. Prior Anticoagulants: The patient has                            taken Pradaxa (dabigatran), last dose was 2 days                            prior to procedure. ASA Grade Assessment: III - A                            patient with severe systemic disease. After                            reviewing the risks and benefits, the patient was  deemed in satisfactory condition to undergo the                            procedure.                           After obtaining informed consent, the colonoscope                            was passed under direct vision. Throughout the                            procedure, the patient's blood pressure, pulse, and                            oxygen saturations were monitored continuously. The        Colonoscope was introduced through the anus and                            advanced to the the terminal ileum. The colonoscopy                            was performed without difficulty. The patient                            tolerated the procedure well. The quality of the                            bowel preparation was adequate. The terminal ileum,                            ileocecal valve, appendiceal orifice, and rectum                            were photographed. Scope In: 9:22:28 AM Scope Out: 9:40:39 AM Scope Withdrawal Time: 0 hours 13 minutes 5 seconds  Total Procedure Duration: 0 hours 18 minutes 11 seconds  Findings:                 The perianal and digital rectal examinations were                            normal.                           A 2 mm polyp was found in the ascending colon. The                            polyp was sessile. The polyp was removed with a                            cold biopsy forceps. Resection and retrieval were                            complete. Estimated blood loss was  minimal.                           Non-bleeding internal hemorrhoids were found during                            retroflexion. The hemorrhoids were small and Grade                            I (internal hemorrhoids that do not prolapse).                           The exam was otherwise normal throughout the                            remainder of the colon.                           The terminal ileum appeared normal. Complications:            No immediate complications. Estimated Blood Loss:     Estimated blood loss was minimal. Impression:               - One 2 mm polyp in the ascending colon, removed                            with a cold biopsy forceps. Resected and retrieved.                           - Non-bleeding internal hemorrhoids.                           - The examined portion of the ileum was normal. Recommendation:           - Patient has a contact  number available for                            emergencies. The signs and symptoms of potential                            delayed complications were discussed with the                            patient. Return to normal activities tomorrow.                            Written discharge instructions were provided to the                            patient.                           - Resume previous diet.                           - Continue present medications.                           -  Await pathology results.                           - Repeat colonoscopy in 5 years for surveillance                            due to family history of CRC <age 97.                           - Return to GI office PRN.                           - Ok to resume Pradaxa today. Gerrit Heck, MD 05/21/2019 9:45:21 AM

## 2019-05-21 NOTE — Patient Instructions (Signed)
Discharge instructions given. Handouts on polyps and hemorrhoids. Resume previous medications. Resume Pradaxa today. YOU HAD AN ENDOSCOPIC PROCEDURE TODAY AT [facility]:   Refer to the procedure report that was given to you for any specific questions about what was found during the examination.  If the procedure report does not answer your questions, please call your gastroenterologist to clarify.  If you requested that your care partner not be given the details of your procedure findings, then the procedure report has been included in a sealed envelope for you to review at your convenience later.  YOU SHOULD EXPECT: Some feelings of bloating in the abdomen. Passage of more gas than usual.  Walking can help get rid of the air that was put into your GI tract during the procedure and reduce the bloating. If you had a lower endoscopy (such as a colonoscopy or flexible sigmoidoscopy) you may notice spotting of blood in your stool or on the toilet paper. If you underwent a bowel prep for your procedure, you may not have a normal bowel movement for a few days.  Please Note:  You might notice some irritation and congestion in your nose or some drainage.  This is from the oxygen used during your procedure.  There is no need for concern and it should clear up in a day or so.  SYMPTOMS TO REPORT IMMEDIATELY:   Following lower endoscopy (colonoscopy or flexible sigmoidoscopy):  Excessive amounts of blood in the stool  Significant tenderness or worsening of abdominal pains  Swelling of the abdomen that is new, acute  Fever of 100F or higher   For urgent or emergent issues, a gastroenterologist can be reached at any hour by calling [phone].   DIET:  We do recommend a small meal at first, but then you may proceed to your regular diet.  Drink plenty of fluids but you should avoid alcoholic beverages for 24 hours.  ACTIVITY:  You should plan to take it easy for the rest of today  and you should NOT DRIVE or use heavy machinery until tomorrow (because of the sedation medicines used during the test).    FOLLOW UP: Our staff will call the number listed on your records 48-72 hours following your procedure to check on you and address any questions or concerns that you may have regarding the information given to you following your procedure. If we do not reach you, we will leave a message.  We will attempt to reach you two times.  During this call, we will ask if you have developed any symptoms of COVID 19. If you develop any symptoms (ie: fever, flu-like symptoms, shortness of breath, cough etc.) before then, please call [phone].  If you test positive for Covid 19 in the 2 weeks post procedure, please call and report this information to [place].    If any biopsies were taken you will be contacted by phone or by letter within the next 1-3 weeks.  Please call us at [phone] if you have not heard about the biopsies in 3 weeks.    SIGNATURES/CONFIDENTIALITY: You and/or your care partner have signed paperwork which will be entered into your electronic medical record.  These signatures attest to the fact that that the information above on your After Visit Summary has been reviewed and is understood.  Full responsibility of the confidentiality of this discharge information lies with you and/or your care-partner.

## 2019-05-21 NOTE — Progress Notes (Signed)
[place]

## 2019-05-21 NOTE — Progress Notes (Signed)
Called to room to assist during endoscopic procedure.  Patient ID and intended procedure confirmed with present staff. Received instructions for my participation in the procedure from the performing physician.  

## 2019-05-21 NOTE — Progress Notes (Signed)
Pt's states no medical or surgical changes since previsit or office visit. 

## 2019-05-24 ENCOUNTER — Telehealth: Payer: Self-pay | Admitting: *Deleted

## 2019-05-24 NOTE — Telephone Encounter (Signed)
  Follow up Call-  Call back number 05/21/2019  Post procedure Call Back phone  # [phone]  Permission to leave phone message Yes  Some recent data might be hidden     Patient questions:  Do you have a fever, pain , or abdominal swelling? No. Pain Score  0 *  Have you tolerated food without any problems? Yes.    Have you been able to return to your normal activities? Yes.    Do you have any questions about your discharge instructions: Diet   No. Medications  No. Follow up visit  No.  Do you have questions or concerns about your Care? No.  Actions: * If pain score is 4 or above: 1. No action needed, pain <4.Have you developed a fever since your procedure? no  2.   Have you had an respiratory symptoms (SOB or cough) since your procedure? no  3.   Have you tested positive for COVID 19 since your procedure no  4.   Have you had any family members/close contacts diagnosed with the COVID 19 since your procedure?  no   If yes to any of these questions please route to Joylene John, RN and Alphonsa Gin, Therapist, sports.

## 2019-05-27 ENCOUNTER — Encounter: Payer: Self-pay | Admitting: Gastroenterology

## 2019-07-08 ENCOUNTER — Telehealth: Payer: Self-pay | Admitting: Hematology & Oncology

## 2019-07-08 ENCOUNTER — Other Ambulatory Visit: Payer: Self-pay

## 2019-07-08 ENCOUNTER — Inpatient Hospital Stay: Payer: 59 | Attending: Hematology & Oncology

## 2019-07-08 ENCOUNTER — Inpatient Hospital Stay (HOSPITAL_BASED_OUTPATIENT_CLINIC_OR_DEPARTMENT_OTHER): Payer: 59 | Admitting: Hematology & Oncology

## 2019-07-08 VITALS — BP 146/92 | HR 72 | Temp 97.8°F | Resp 17 | Wt 281.5 lb

## 2019-07-08 DIAGNOSIS — Z7982 Long term (current) use of aspirin: Secondary | ICD-10-CM | POA: Insufficient documentation

## 2019-07-08 DIAGNOSIS — D6862 Lupus anticoagulant syndrome: Secondary | ICD-10-CM

## 2019-07-08 DIAGNOSIS — Z79899 Other long term (current) drug therapy: Secondary | ICD-10-CM | POA: Insufficient documentation

## 2019-07-08 DIAGNOSIS — Z7901 Long term (current) use of anticoagulants: Secondary | ICD-10-CM | POA: Insufficient documentation

## 2019-07-08 DIAGNOSIS — Z86711 Personal history of pulmonary embolism: Secondary | ICD-10-CM | POA: Insufficient documentation

## 2019-07-08 DIAGNOSIS — R42 Dizziness and giddiness: Secondary | ICD-10-CM | POA: Insufficient documentation

## 2019-07-08 LAB — CBC WITH DIFFERENTIAL (CANCER CENTER ONLY)
Abs Immature Granulocytes: 0.03 10*3/uL (ref 0.00–0.07)
Basophils Absolute: 0 10*3/uL (ref 0.0–0.1)
Basophils Relative: 1 %
Eosinophils Absolute: 0.3 10*3/uL (ref 0.0–0.5)
Eosinophils Relative: 5 %
HCT: 49.4 % (ref 39.0–52.0)
Hemoglobin: 16.4 g/dL (ref 13.0–17.0)
Immature Granulocytes: 0 %
Lymphocytes Relative: 29 %
Lymphs Abs: 2.1 10*3/uL (ref 0.7–4.0)
MCH: 26.8 pg (ref 26.0–34.0)
MCHC: 33.2 g/dL (ref 30.0–36.0)
MCV: 80.9 fL (ref 80.0–100.0)
Monocytes Absolute: 0.6 10*3/uL (ref 0.1–1.0)
Monocytes Relative: 9 %
Neutro Abs: 4 10*3/uL (ref 1.7–7.7)
Neutrophils Relative %: 56 %
Platelet Count: 220 10*3/uL (ref 150–400)
RBC: 6.11 MIL/uL — ABNORMAL HIGH (ref 4.22–5.81)
RDW: 13.9 % (ref 11.5–15.5)
WBC Count: 7.1 10*3/uL (ref 4.0–10.5)
nRBC: 0 % (ref 0.0–0.2)

## 2019-07-08 LAB — CMP (CANCER CENTER ONLY)
ALT: 20 U/L (ref 0–44)
AST: 17 U/L (ref 15–41)
Albumin: 4.5 g/dL (ref 3.5–5.0)
Alkaline Phosphatase: 60 U/L (ref 38–126)
Anion gap: 6 (ref 5–15)
BUN: 15 mg/dL (ref 6–20)
CO2: 29 mmol/L (ref 22–32)
Calcium: 9.9 mg/dL (ref 8.9–10.3)
Chloride: 103 mmol/L (ref 98–111)
Creatinine: 1.2 mg/dL (ref 0.61–1.24)
GFR, Est AFR Am: >60 mL/min (ref >60)
GFR, Estimated: >60 mL/min (ref >60)
Glucose, Bld: 106 mg/dL — ABNORMAL HIGH (ref 70–99)
Potassium: 4.6 mmol/L (ref 3.5–5.1)
Sodium: 138 mmol/L (ref 135–145)
Total Bilirubin: 0.8 mg/dL (ref 0.3–1.2)
Total Protein: 7.1 g/dL (ref 6.5–8.1)

## 2019-07-08 NOTE — Telephone Encounter (Signed)
Appointments scheduled and I spoke with patient as he has requested an early AM appointment.  No calendar printed due to My Chart per 4/5 los

## 2019-07-08 NOTE — Progress Notes (Signed)
Hematology and Oncology Follow Up Visit  Lee Phillips [id number] April 17, 1960 59 y.o. 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  Lee Phillips is back for for follow-up.  He is having a little bit of vertigo today.  He says he gets this on occasion.  He really does not take anything for this.  Thankfully, he did not get the coronavirus last year.  He did get his vaccine about a month ago.  He has been working.  He is an essential city employee.  Thankfully, he was not furloughed.  He did have cataract surgery back in December.  He had a colonoscopy a month ago.  He had a polyp.  He is done well on the Pradaxa and aspirin.  He does have the persistently positive lupus anticoagulant.  He has had no issues with bowels or bladder.  He has had no problems with leg swelling.  He has had no headache.  There is no tingling in his hands or feet.  Overall, I would have to say his performance status is ECOG 1.     Medications:  Current Outpatient Medications:  .  aspirin 81 MG chewable tablet, Chew 2 tablets (162 mg total) by mouth daily., Disp: 30 tablet, Rfl: 0 .  lisinopril-hydrochlorothiazide (PRINZIDE,ZESTORETIC) 20-12.5 MG per tablet, Take 1 tablet by mouth daily., Disp: , Rfl:  .  metoprolol (LOPRESSOR) 25 MG tablet, Take 1 tablet (25 mg total) by mouth 2 (two) times daily., Disp: 60 tablet, Rfl: 0 .  Multiple Vitamins-Minerals (MULTI-VITAMIN GUMMIES PO), Take 2 tablets by mouth daily., Disp: , Rfl:  .  PRADAXA 150 MG CAPS capsule, TAKE 1 CAPSULE BY MOUTH  EVERY 12 HOURS, Disp: 180 capsule, Rfl: 3  Current Facility-Administered Medications:  .  0.9 %  sodium chloride infusion, 500 mL, Intravenous, Once, Cirigliano, Vito V, DO  Allergies:  No Known  Allergies  Past Medical History, Surgical history, Social history, and Family History were reviewed and updated.  Review of Systems: Review of Systems  Constitutional: Negative.   HENT: Negative.   Eyes: Negative.   Respiratory: Negative.   Cardiovascular: Negative.   Gastrointestinal: Negative.   Genitourinary: Negative.   Musculoskeletal: Negative.   Skin: Negative.   Neurological: Negative.   Endo/Heme/Allergies: Negative.   Psychiatric/Behavioral: Negative.      Physical Exam:  weight is 281 lb 8 oz (127.7 kg). His temporal temperature is 97.8 F (36.6 C). His blood pressure is 146/92 (abnormal) and his pulse is 72. His respiration is 17 and oxygen saturation is 100%.   Physical Exam Vitals reviewed.  HENT:     Head: Normocephalic and atraumatic.  Eyes:     Pupils: Pupils are equal, round, and reactive to light.  Cardiovascular:     Rate and Rhythm: Normal rate and regular rhythm.     Heart sounds: Normal heart sounds.  Pulmonary:     Effort: Pulmonary effort is normal.     Breath sounds: Normal breath sounds.  Abdominal:     General: Bowel sounds are normal.     Palpations: Abdomen is soft.  Musculoskeletal:        General: No tenderness or deformity. Normal range of motion.     Cervical back: Normal range of  motion.  Lymphadenopathy:     Cervical: No cervical adenopathy.  Skin:    General: Skin is warm and dry.     Findings: No erythema or rash.  Neurological:     Mental Status: He is alert and oriented to person, place, and time.  Psychiatric:        Behavior: Behavior normal.        Thought Content: Thought content normal.        Judgment: Judgment normal.      Lab Results  Component Value Date   WBC 7.1 07/08/2019   HGB 16.4 07/08/2019   HCT 49.4 07/08/2019   MCV 80.9 07/08/2019   PLT 220 07/08/2019     Chemistry      Component Value Date/Time   NA 138 07/08/2019 1359   NA 141 02/27/2017 0745   NA 138 02/24/2016 0753      Component  Value Date/Time   CALCIUM 9.9 07/08/2019 1359   CALCIUM 9.5 02/27/2017 0745   CALCIUM 9.5 02/24/2016 0753         Impression and Plan: [name] is 59 year old gentleman. He is a lifelong anticoagulation.   I really think that everything is going well with him with respect to hypercoagulability.  I think the Pradaxa/aspirin combination is a good combination for him.  We will plan to get him back in 1 year now.  I told him if this vertigo becomes more of an issue, I am sure that his family doctor will be able to manage it.    Volanda Napoleon, MD 4/5/20213:02 PM

## 2019-09-22 ENCOUNTER — Other Ambulatory Visit: Payer: Self-pay | Admitting: Hematology & Oncology

## 2020-02-05 ENCOUNTER — Other Ambulatory Visit: Payer: Self-pay | Admitting: Unknown Physician Specialty

## 2020-02-05 ENCOUNTER — Encounter: Payer: Self-pay | Admitting: Unknown Physician Specialty

## 2020-02-05 ENCOUNTER — Telehealth: Payer: Self-pay | Admitting: Unknown Physician Specialty

## 2020-02-05 ENCOUNTER — Ambulatory Visit (HOSPITAL_COMMUNITY): Payer: 59

## 2020-02-05 DIAGNOSIS — U071 COVID-19: Secondary | ICD-10-CM

## 2020-02-05 DIAGNOSIS — I4819 Other persistent atrial fibrillation: Secondary | ICD-10-CM

## 2020-02-05 DIAGNOSIS — I10 Essential (primary) hypertension: Secondary | ICD-10-CM

## 2020-02-05 NOTE — Telephone Encounter (Signed)
I connected by phone with Lee Phillips on 02/05/2020 at 7:58 AM to discuss the potential use of a new treatment for mild to moderate COVID-19 viral infection in non-hospitalized patients.  This patient is a 59 y.o. male that meets the FDA criteria for Emergency Use Authorization of COVID monoclonal antibody casirivimab/imdevimab or bamlanivimab/eteseviamb.  Has a (+) direct SARS-CoV-2 viral test result  Has mild or moderate COVID-19   Is NOT hospitalized due to COVID-19  Is within 10 days of symptom onset  Has at least one of the high risk factor(s) for progression to severe COVID-19 and/or hospitalization as defined in EUA.  Specific high risk criteria : BMI > 25 and Cardiovascular disease or hypertension   I have spoken and communicated the following to the patient or parent/caregiver regarding COVID monoclonal antibody treatment:  1. FDA has authorized the emergency use for the treatment of mild to moderate COVID-19 in adults and pediatric patients with positive results of direct SARS-CoV-2 viral testing who are 47 years of age and older weighing at least 40 kg, and who are at high risk for progressing to severe COVID-19 and/or hospitalization.  2. The significant known and potential risks and benefits of COVID monoclonal antibody, and the extent to which such potential risks and benefits are unknown.  3. Information on available alternative treatments and the risks and benefits of those alternatives, including clinical trials.  4. Patients treated with COVID monoclonal antibody should continue to self-isolate and use infection control measures (e.g., wear mask, isolate, social distance, avoid sharing personal items, clean and disinfect high touch surfaces, and frequent handwashing) according to CDC guidelines.   5. The patient or parent/caregiver has the option to accept or refuse COVID monoclonal antibody treatment.  After reviewing this information with the patient, the  patient has agreed to receive one of the available covid 19 monoclonal antibodies and will be provided an appropriate fact sheet prior to infusion. Lee Haddock, NP 02/05/2020 7:58 AM Sx onset 10/30

## 2020-02-05 NOTE — Progress Notes (Signed)
Sx onset 10/30

## 2020-02-06 ENCOUNTER — Other Ambulatory Visit (HOSPITAL_COMMUNITY): Payer: Self-pay

## 2020-02-06 ENCOUNTER — Ambulatory Visit (HOSPITAL_COMMUNITY)
Admission: RE | Admit: 2020-02-06 | Discharge: 2020-02-06 | Disposition: A | Discharge status: Home / Self Care | Payer: 59 | Source: Ambulatory Visit | Attending: Pulmonary Disease | Admitting: Pulmonary Disease

## 2020-02-06 DIAGNOSIS — I10 Essential (primary) hypertension: Secondary | ICD-10-CM | POA: Insufficient documentation

## 2020-02-06 DIAGNOSIS — I4819 Other persistent atrial fibrillation: Secondary | ICD-10-CM | POA: Insufficient documentation

## 2020-02-06 DIAGNOSIS — U071 COVID-19: Secondary | ICD-10-CM | POA: Diagnosis not present

## 2020-02-06 MED ORDER — FAMOTIDINE IN NACL 20-0.9 MG/50ML-% IV SOLN: 20.0000 mg | Freq: Once | INTRAVENOUS | Status: DC | PRN

## 2020-02-06 MED ORDER — EPINEPHRINE 0.3 MG/0.3ML IJ SOAJ: 0.3000 mg | Freq: Once | INTRAMUSCULAR | Status: DC | PRN

## 2020-02-06 MED ORDER — SODIUM CHLORIDE 0.9 % IV SOLN: INTRAVENOUS | Status: DC | PRN

## 2020-02-06 MED ORDER — METHYLPREDNISOLONE SODIUM SUCC 125 MG IJ SOLR: 125.0000 mg | Freq: Once | INTRAMUSCULAR | Status: DC | PRN

## 2020-02-06 MED ORDER — ALBUTEROL SULFATE HFA 108 (90 BASE) MCG/ACT IN AERS: 2.0000 | INHALATION_SPRAY | Freq: Once | RESPIRATORY_TRACT | Status: DC | PRN

## 2020-02-06 MED ORDER — DIPHENHYDRAMINE HCL 50 MG/ML IJ SOLN: 50.0000 mg | Freq: Once | INTRAMUSCULAR | Status: DC | PRN

## 2020-02-06 MED ORDER — SOTROVIMAB 500 MG/8ML IV SOLN
500.0000 mg | Freq: Once | INTRAVENOUS | Status: AC
  Administered 2020-02-06: 500 mg via INTRAVENOUS

## 2020-02-06 NOTE — Discharge Instructions (Signed)

## 2020-02-06 NOTE — Progress Notes (Signed)
  Diagnosis: COVID-19  Physician:Dr Joya Gaskins   Procedure: [facility] Med: Sotrovimab infusion-provided patient with Sotrovimab fact sheet for patients, parents, and caregivers prior to infusion.   Complications: No immediate complications noted.  Discharge: Discharged home   Lee Phillips 02/06/2020

## 2020-07-06 ENCOUNTER — Ambulatory Visit: Payer: 59 | Admitting: Hematology & Oncology

## 2020-07-06 ENCOUNTER — Other Ambulatory Visit: Payer: 59

## 2020-07-06 ENCOUNTER — Encounter: Payer: Self-pay | Admitting: *Deleted

## 2020-07-06 ENCOUNTER — Inpatient Hospital Stay (HOSPITAL_BASED_OUTPATIENT_CLINIC_OR_DEPARTMENT_OTHER): Payer: 59 | Admitting: Hematology & Oncology

## 2020-07-06 ENCOUNTER — Encounter: Payer: Self-pay | Admitting: Hematology & Oncology

## 2020-07-06 ENCOUNTER — Inpatient Hospital Stay: Payer: 59 | Attending: Hematology & Oncology

## 2020-07-06 VITALS — BP 142/92 | HR 73 | Temp 98.1°F | Resp 18 | Wt 272.0 lb

## 2020-07-06 DIAGNOSIS — D6862 Lupus anticoagulant syndrome: Secondary | ICD-10-CM

## 2020-07-06 DIAGNOSIS — Z86711 Personal history of pulmonary embolism: Secondary | ICD-10-CM | POA: Diagnosis not present

## 2020-07-06 DIAGNOSIS — M7989 Other specified soft tissue disorders: Secondary | ICD-10-CM | POA: Diagnosis not present

## 2020-07-06 DIAGNOSIS — Z7901 Long term (current) use of anticoagulants: Secondary | ICD-10-CM | POA: Insufficient documentation

## 2020-07-06 LAB — CBC WITH DIFFERENTIAL (CANCER CENTER ONLY)
Abs Immature Granulocytes: 0.03 10*3/uL (ref 0.00–0.07)
Basophils Absolute: 0 10*3/uL (ref 0.0–0.1)
Basophils Relative: 1 %
Eosinophils Absolute: 0.2 10*3/uL (ref 0.0–0.5)
Eosinophils Relative: 3 %
HCT: 49.7 % (ref 39.0–52.0)
Hemoglobin: 16.7 g/dL (ref 13.0–17.0)
Immature Granulocytes: 0 %
Lymphocytes Relative: 20 %
Lymphs Abs: 1.5 10*3/uL (ref 0.7–4.0)
MCH: 27.2 pg (ref 26.0–34.0)
MCHC: 33.6 g/dL (ref 30.0–36.0)
MCV: 80.8 fL (ref 80.0–100.0)
Monocytes Absolute: 0.6 10*3/uL (ref 0.1–1.0)
Monocytes Relative: 8 %
Neutro Abs: 4.8 10*3/uL (ref 1.7–7.7)
Neutrophils Relative %: 68 %
Platelet Count: 213 10*3/uL (ref 150–400)
RBC: 6.15 MIL/uL — ABNORMAL HIGH (ref 4.22–5.81)
RDW: 13.5 % (ref 11.5–15.5)
WBC Count: 7.1 10*3/uL (ref 4.0–10.5)
nRBC: 0 % (ref 0.0–0.2)

## 2020-07-06 LAB — CMP (CANCER CENTER ONLY)
ALT: 18 U/L (ref 0–44)
AST: 16 U/L (ref 15–41)
Albumin: 4.5 g/dL (ref 3.5–5.0)
Alkaline Phosphatase: 66 U/L (ref 38–126)
Anion gap: 6 (ref 5–15)
BUN: 15 mg/dL (ref 8–23)
CO2: 31 mmol/L (ref 22–32)
Calcium: 10.3 mg/dL (ref 8.9–10.3)
Chloride: 103 mmol/L (ref 98–111)
Creatinine: 1.18 mg/dL (ref 0.61–1.24)
GFR, Estimated: >60 mL/min (ref >60)
Glucose, Bld: 108 mg/dL — ABNORMAL HIGH (ref 70–99)
Potassium: 4.6 mmol/L (ref 3.5–5.1)
Sodium: 140 mmol/L (ref 135–145)
Total Bilirubin: 0.9 mg/dL (ref 0.3–1.2)
Total Protein: 7.3 g/dL (ref 6.5–8.1)

## 2020-07-06 NOTE — Progress Notes (Signed)
Hematology and Oncology Follow Up Visit  Lee Phillips [id number] 14-Aug-1960 60 y.o. 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 is back for for follow-up.  Is been a year since we last saw him.  He has been doing fairly well.  He has been working quite a bit.  He did have the coronavirus back in October.  He was sick for about 4 days.  He was not hospitalized.  He has had no issues with cough.  He has had no nausea or vomiting.  He has had no change in bowel or bladder habits.  He still is on Pradaxa and aspirin.  Is doing well with this.  There is no bleeding.  The coronavirus did not cause any issues with thromboembolic disease.  He has had no rashes.  He has chronic swelling in the right leg.  He does have a compression stocking on.  This does help.   Overall, I would have to say performance status is ECOG 1.     Medications:  Current Outpatient Medications:  .  aspirin 81 MG chewable tablet, Chew 2 tablets (162 mg total) by mouth daily., Disp: 30 tablet, Rfl: 0 .  lisinopril-hydrochlorothiazide (PRINZIDE,ZESTORETIC) 20-12.5 MG per tablet, Take 1 tablet by mouth daily., Disp: , Rfl:  .  metoprolol (LOPRESSOR) 25 MG tablet, Take 1 tablet (25 mg total) by mouth 2 (two) times daily., Disp: 60 tablet, Rfl: 0 .  Multiple Vitamins-Minerals (MULTI-VITAMIN GUMMIES PO), Take 2 tablets by mouth daily., Disp: , Rfl:  .  PRADAXA 150 MG CAPS capsule, TAKE 1 CAPSULE BY MOUTH  EVERY 12 HOURS, Disp: 180 capsule, Rfl: 3  Current Facility-Administered Medications:  .  0.9 %  sodium chloride infusion, 500 mL, Intravenous, Once, Cirigliano, Vito V, DO  Allergies:  Allergies  Allergen Reactions  . Suvorexant Other (See Comments)  . Trazodone Hcl  Other (See Comments)    Past Medical History, Surgical history, Social history, and Family History were reviewed and updated.  Review of Systems: Review of Systems  Constitutional: Negative.   HENT: Negative.   Eyes: Negative.   Respiratory: Negative.   Cardiovascular: Negative.   Gastrointestinal: Negative.   Genitourinary: Negative.   Musculoskeletal: Negative.   Skin: Negative.   Neurological: Negative.   Endo/Heme/Allergies: Negative.   Psychiatric/Behavioral: Negative.      Physical Exam:  weight is 272 lb (123.4 kg). His oral temperature is 98.1 F (36.7 C). His blood pressure is 142/92 (abnormal) and his pulse is 73. His respiration is 18.   Physical Exam Vitals reviewed.  HENT:     Head: Normocephalic and atraumatic.  Eyes:     Pupils: Pupils are equal, round, and reactive to light.  Cardiovascular:     Rate and Rhythm: Normal rate and regular rhythm.     Heart sounds: Normal heart sounds.  Pulmonary:     Effort: Pulmonary effort is normal.     Breath sounds: Normal breath sounds.  Abdominal:     General: Bowel sounds are normal.     Palpations: Abdomen is soft.  Musculoskeletal:        General: No tenderness or deformity. Normal range of motion.     Cervical back: Normal range of motion.  Lymphadenopathy:     Cervical: No cervical adenopathy.  Skin:    General: Skin is warm and dry.     Findings: No erythema or rash.  Neurological:     Mental Status: He is alert and oriented to person, place, and time.  Psychiatric:        Behavior: Behavior normal.        Thought Content: Thought content normal.        Judgment: Judgment normal.      Lab Results  Component Value Date   WBC 7.1 07/06/2020   HGB 16.7 07/06/2020   HCT 49.7 07/06/2020   MCV 80.8 07/06/2020   PLT 213 07/06/2020     Chemistry      Component Value Date/Time   NA 138 07/08/2019 1359   NA 141 02/27/2017 0745   NA 138 02/24/2016 0753      Component Value Date/Time   CALCIUM  9.9 07/08/2019 1359   CALCIUM 9.5 02/27/2017 0745   CALCIUM 9.5 02/24/2016 0753      Impression and Plan: Lee Phillips is 60 year old gentleman. He is a lifelong anticoagulation.   I really think that everything is going well with him with respect to hypercoagulability.  I think the Pradaxa/aspirin combination is a good combination for him.  We will plan to get him back in 1 year now.   Volanda Napoleon, MD 4/4/20228:06 AM

## 2020-08-25 ENCOUNTER — Other Ambulatory Visit: Payer: Self-pay | Admitting: Hematology & Oncology

## 2021-07-06 ENCOUNTER — Telehealth: Payer: Self-pay

## 2021-07-06 ENCOUNTER — Encounter: Payer: Self-pay | Admitting: Hematology & Oncology

## 2021-07-06 ENCOUNTER — Inpatient Hospital Stay: Payer: 59 | Admitting: Hematology & Oncology

## 2021-07-06 ENCOUNTER — Other Ambulatory Visit: Payer: Self-pay

## 2021-07-06 ENCOUNTER — Inpatient Hospital Stay: Payer: 59 | Attending: Hematology & Oncology

## 2021-07-06 VITALS — BP 142/97 | HR 58 | Temp 98.2°F | Resp 16 | Wt 266.0 lb

## 2021-07-06 DIAGNOSIS — Z86718 Personal history of other venous thrombosis and embolism: Secondary | ICD-10-CM | POA: Diagnosis not present

## 2021-07-06 DIAGNOSIS — D6862 Lupus anticoagulant syndrome: Secondary | ICD-10-CM | POA: Diagnosis not present

## 2021-07-06 DIAGNOSIS — Z86711 Personal history of pulmonary embolism: Secondary | ICD-10-CM | POA: Diagnosis present

## 2021-07-06 DIAGNOSIS — Z7901 Long term (current) use of anticoagulants: Secondary | ICD-10-CM | POA: Diagnosis not present

## 2021-07-06 LAB — CMP (CANCER CENTER ONLY)
ALT: 19 U/L (ref 0–44)
AST: 15 U/L (ref 15–41)
Albumin: 4.6 g/dL (ref 3.5–5.0)
Alkaline Phosphatase: 63 U/L (ref 38–126)
Anion gap: 5 (ref 5–15)
BUN: 15 mg/dL (ref 8–23)
CO2: 31 mmol/L (ref 22–32)
Calcium: 10.4 mg/dL — ABNORMAL HIGH (ref 8.9–10.3)
Chloride: 101 mmol/L (ref 98–111)
Creatinine: 1.14 mg/dL (ref 0.61–1.24)
GFR, Estimated: >60 mL/min (ref >60)
Glucose, Bld: 94 mg/dL (ref 70–99)
Potassium: 4 mmol/L (ref 3.5–5.1)
Sodium: 137 mmol/L (ref 135–145)
Total Bilirubin: 1 mg/dL (ref 0.3–1.2)
Total Protein: 7.6 g/dL (ref 6.5–8.1)

## 2021-07-06 LAB — CBC WITH DIFFERENTIAL (CANCER CENTER ONLY)
Abs Immature Granulocytes: 0.01 10*3/uL (ref 0.00–0.07)
Basophils Absolute: 0 10*3/uL (ref 0.0–0.1)
Basophils Relative: 1 %
Eosinophils Absolute: 0.2 10*3/uL (ref 0.0–0.5)
Eosinophils Relative: 3 %
HCT: 50.3 % (ref 39.0–52.0)
Hemoglobin: 16.8 g/dL (ref 13.0–17.0)
Immature Granulocytes: 0 %
Lymphocytes Relative: 24 %
Lymphs Abs: 1.6 10*3/uL (ref 0.7–4.0)
MCH: 26.9 pg (ref 26.0–34.0)
MCHC: 33.4 g/dL (ref 30.0–36.0)
MCV: 80.6 fL (ref 80.0–100.0)
Monocytes Absolute: 0.7 10*3/uL (ref 0.1–1.0)
Monocytes Relative: 10 %
Neutro Abs: 4 10*3/uL (ref 1.7–7.7)
Neutrophils Relative %: 62 %
Platelet Count: 225 10*3/uL (ref 150–400)
RBC: 6.24 MIL/uL — ABNORMAL HIGH (ref 4.22–5.81)
RDW: 13.7 % (ref 11.5–15.5)
WBC Count: 6.5 10*3/uL (ref 4.0–10.5)
nRBC: 0 % (ref 0.0–0.2)

## 2021-07-06 NOTE — Progress Notes (Signed)
Hematology and Oncology Follow Up Visit ? ?Lee Phillips ?[id number] ?1960-04-21 61 y.o. ?07/06/2021 ? ? 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 ? ?Current Therapy:   ?Pradaxa 150 mg po BID ?EC ASA 162 mg po q day  ? ?   ?Interim History:  Mr.  Lee Phillips is back for for follow-up.  It has been a year since we last saw him.  He has been busy at work.  He actually has to work on [employer] weekend.  I really hate this for him. ? ?The problem that he has is that he says that he has lost little bit of vision in the right eye.  He says have about a week ago.  He says part of the visual field is cut.  I do not know if this is from a detached retina.  Try to take a look into his fundi.  I do not see anything obvious.  I told him that he really must see his eye doctor today. ? ?Otherwise, he is doing okay on the Pradaxa and aspirin.  He has had no obvious bleeding.  He does have some chronic swelling in the right leg.  Does wear compression stocking. ? ?He has had no change in bowel or bladder habits.  He has had no rashes.  He has had no nausea or vomiting. ? ?He had COVID for the second time a couple months ago.  He said he lost taste.  He did not take any oral medication for this. ? ?He has had no headache. ? ?Overall, I would say his performance status is probably ECOG 1.   ? ? Medications:  ?Current Outpatient Medications:  ?  aspirin 81 MG chewable tablet, Chew 2 tablets (162 mg total) by mouth daily., Disp: 30 tablet, Rfl: 0 ?  lisinopril-hydrochlorothiazide (PRINZIDE,ZESTORETIC) 20-12.5 MG per tablet, Take 1 tablet by mouth daily., Disp: , Rfl:  ?  metoprolol (LOPRESSOR) 25 MG tablet, Take 1 tablet (25 mg total) by mouth 2 (two) times daily., Disp: 60 tablet, Rfl: 0 ?  Multiple Vitamins-Minerals (MULTI-VITAMIN GUMMIES PO), Take 2 tablets by mouth daily., Disp: , Rfl:  ?  PRADAXA 150 MG  CAPS capsule, TAKE 1 CAPSULE BY MOUTH  EVERY 12 HOURS, Disp: 180 capsule, Rfl: 3 ? ?Current Facility-Administered Medications:  ?  0.9 %  sodium chloride infusion, 500 mL, Intravenous, Once, Cirigliano, Vito V, DO ? ?Allergies:  ?Allergies  ?Allergen Reactions  ? Suvorexant Other (See Comments)  ? Trazodone Hcl Other (See Comments)  ? ? ?Past Medical History, Surgical history, Social history, and Family History were reviewed and updated. ? ?Review of Systems: ?Review of Systems  ?Constitutional: Negative.   ?HENT: Negative.    ?Eyes: Negative.   ?Respiratory: Negative.    ?Cardiovascular: Negative.   ?Gastrointestinal: Negative.   ?Genitourinary: Negative.   ?Musculoskeletal: Negative.   ?Skin: Negative.   ?Neurological: Negative.   ?Endo/Heme/Allergies: Negative.   ?Psychiatric/Behavioral: Negative.    ? ? ?Physical Exam: ? weight is 266 lb (120.7 kg). His oral temperature is 98.2 ?F (36.8 ?C). His blood pressure is 142/97 (abnormal) and his pulse is 58 (abnormal). His respiration is 16 and oxygen saturation is 98%.  ? ?Physical Exam ?Vitals reviewed.  ?HENT:  ?   Head: Normocephalic and atraumatic.  ?Eyes:  ?   Pupils: Pupils are equal, round, and reactive to light.  ?Cardiovascular:  ?  Rate and Rhythm: Normal rate and regular rhythm.  ?   Heart sounds: Normal heart sounds.  ?Pulmonary:  ?   Effort: Pulmonary effort is normal.  ?   Breath sounds: Normal breath sounds.  ?Abdominal:  ?   General: Bowel sounds are normal.  ?   Palpations: Abdomen is soft.  ?Musculoskeletal:     ?   General: No tenderness or deformity. Normal range of motion.  ?   Cervical back: Normal range of motion.  ?Lymphadenopathy:  ?   Cervical: No cervical adenopathy.  ?Skin: ?   General: Skin is warm and dry.  ?   Findings: No erythema or rash.  ?Neurological:  ?   Mental Status: He is alert and oriented to person, place, and time.  ?Psychiatric:     ?   Behavior: Behavior normal.     ?   Thought Content: Thought content normal.     ?    Judgment: Judgment normal.  ? ? ? ?Lab Results  ?Component Value Date  ? WBC 6.5 07/06/2021  ? HGB 16.8 07/06/2021  ? HCT 50.3 07/06/2021  ? MCV 80.6 07/06/2021  ? PLT 225 07/06/2021  ? ?  Chemistry   ?   ?Component Value Date/Time  ? NA 137 07/06/2021 0751  ? NA 141 02/27/2017 0745  ? NA 138 02/24/2016 0753  ?    ?Component Value Date/Time  ? CALCIUM 10.4 (H) 07/06/2021 0751  ? CALCIUM 9.5 02/27/2017 0745  ? CALCIUM 9.5 02/24/2016 0753  ?  ? ? ?Impression and Plan: ?Lee Phillips is 61 year old gentleman.  He has a positive lupus anticoagulant.  He is on lifelong anticoagulation. ? ?My concern right now is that the right eye.  Again I am not sure what could be going on.  I really hope that he does see his eye doctor today.  Again it sounds like this might be a detached retina. ? ?We will still plan to see him back in 1 year.  I told him if the eye doctor has any issues with his anticoagulation, he/she can always call us.   ? ?Volanda Napoleon, MD ?4/4/20238:30 AM ?

## 2021-07-06 NOTE — Telephone Encounter (Signed)
Pt called stating he has surgery Thursday fro detached retina and wants to know how long he needs to be off his pradaxa. ? ?Discussed with MD, pt to stop pradaxa and ASA today and restart both 3 days after surgery. Pt called and informed and will call if he has any concerns.  ?

## 2021-07-14 LAB — HEXAGONAL PHASE PHOSPHOLIPID: Hexagonal Phase Phospholipid: >110 s — ABNORMAL HIGH (ref 0–11)

## 2021-07-14 LAB — DRVVT MIX: dRVVT Mix: 174 s — ABNORMAL HIGH (ref 0.0–40.4)

## 2021-07-14 LAB — LUPUS ANTICOAGULANT PANEL
DRVVT: >180 s — ABNORMAL HIGH (ref 0.0–47.0)
PTT Lupus Anticoagulant: >160 s — ABNORMAL HIGH (ref 0.0–43.5)

## 2021-07-14 LAB — DRVVT CONFIRM: dRVVT Confirm: >2.2 ratio — ABNORMAL HIGH (ref 0.8–1.2)

## 2021-07-14 LAB — PTT-LA MIX: PTT-LA Mix: >160 s — ABNORMAL HIGH (ref 0.0–40.5)

## 2021-07-29 ENCOUNTER — Other Ambulatory Visit: Payer: Self-pay | Admitting: Hematology & Oncology

## 2021-08-16 ENCOUNTER — Other Ambulatory Visit: Payer: Self-pay

## 2021-08-16 ENCOUNTER — Encounter (HOSPITAL_COMMUNITY): Payer: Self-pay

## 2021-08-16 ENCOUNTER — Emergency Department (HOSPITAL_COMMUNITY)
Admission: EM | Admit: 2021-08-16 | Discharge: 2021-08-17 | Disposition: A | Discharge status: Home / Self Care | Payer: 59 | Attending: Emergency Medicine | Admitting: Emergency Medicine

## 2021-08-16 ENCOUNTER — Emergency Department (HOSPITAL_COMMUNITY): Payer: 59

## 2021-08-16 DIAGNOSIS — Z7982 Long term (current) use of aspirin: Secondary | ICD-10-CM | POA: Insufficient documentation

## 2021-08-16 DIAGNOSIS — I10 Essential (primary) hypertension: Secondary | ICD-10-CM | POA: Insufficient documentation

## 2021-08-16 DIAGNOSIS — Z7901 Long term (current) use of anticoagulants: Secondary | ICD-10-CM | POA: Insufficient documentation

## 2021-08-16 DIAGNOSIS — Z79899 Other long term (current) drug therapy: Secondary | ICD-10-CM | POA: Insufficient documentation

## 2021-08-16 DIAGNOSIS — Z87442 Personal history of urinary calculi: Secondary | ICD-10-CM | POA: Insufficient documentation

## 2021-08-16 DIAGNOSIS — R030 Elevated blood-pressure reading, without diagnosis of hypertension: Secondary | ICD-10-CM

## 2021-08-16 DIAGNOSIS — R600 Localized edema: Secondary | ICD-10-CM | POA: Diagnosis not present

## 2021-08-16 LAB — CBC WITH DIFFERENTIAL/PLATELET
Abs Immature Granulocytes: 0.03 10*3/uL (ref 0.00–0.07)
Basophils Absolute: 0 10*3/uL (ref 0.0–0.1)
Basophils Relative: 1 %
Eosinophils Absolute: 0.1 10*3/uL (ref 0.0–0.5)
Eosinophils Relative: 2 %
HCT: 50 % (ref 39.0–52.0)
Hemoglobin: 16.3 g/dL (ref 13.0–17.0)
Immature Granulocytes: 0 %
Lymphocytes Relative: 21 %
Lymphs Abs: 1.6 10*3/uL (ref 0.7–4.0)
MCH: 26.7 pg (ref 26.0–34.0)
MCHC: 32.6 g/dL (ref 30.0–36.0)
MCV: 82 fL (ref 80.0–100.0)
Monocytes Absolute: 0.7 10*3/uL (ref 0.1–1.0)
Monocytes Relative: 9 %
Neutro Abs: 5.1 10*3/uL (ref 1.7–7.7)
Neutrophils Relative %: 67 %
Platelets: 212 10*3/uL (ref 150–400)
RBC: 6.1 MIL/uL — ABNORMAL HIGH (ref 4.22–5.81)
RDW: 13.8 % (ref 11.5–15.5)
WBC: 7.6 10*3/uL (ref 4.0–10.5)
nRBC: 0 % (ref 0.0–0.2)

## 2021-08-16 LAB — COMPREHENSIVE METABOLIC PANEL
ALT: 27 U/L (ref 0–44)
AST: 24 U/L (ref 15–41)
Albumin: 4.4 g/dL (ref 3.5–5.0)
Alkaline Phosphatase: 57 U/L (ref 38–126)
Anion gap: 9 (ref 5–15)
BUN: 14 mg/dL (ref 8–23)
CO2: 24 mmol/L (ref 22–32)
Calcium: 9.8 mg/dL (ref 8.9–10.3)
Chloride: 104 mmol/L (ref 98–111)
Creatinine, Ser: 1.12 mg/dL (ref 0.61–1.24)
GFR, Estimated: >60 mL/min (ref >60)
Glucose, Bld: 94 mg/dL (ref 70–99)
Potassium: 4.3 mmol/L (ref 3.5–5.1)
Sodium: 137 mmol/L (ref 135–145)
Total Bilirubin: 1 mg/dL (ref 0.3–1.2)
Total Protein: 7.6 g/dL (ref 6.5–8.1)

## 2021-08-16 LAB — TROPONIN I (HIGH SENSITIVITY): Troponin I (High Sensitivity): 4 ng/L (ref <18)

## 2021-08-16 NOTE — ED Triage Notes (Addendum)
Patient arrives POV from urgent care c/o high BP; at urgent care BP was 182/99 and home it was 183/103. Pt has hx of high blood pressure and afib; baseline BP is in 130/80 per pt. Pt reports having indigestion earlier today 1pm today. Pt a&o x4, no acute distress. Pt denies CP. Takes Pradaxa for afib. ?

## 2021-08-16 NOTE — ED Provider Triage Note (Signed)
Emergency Medicine Provider Triage Evaluation Note ? ?Lee Phillips , a 61 y.o. male  was evaluated in triage.  Pt complains of high blood pressure and indigestion.  Noticed this morning that his blood pressure was in the 725D systolic.  He does take lisinopril and was recently increased on the dose.  Denies any chest pain but does report feelings of indigestion.  No shortness of breath.  No headache, blurry vision or numbness. ? ?Review of Systems  ?Positive: Indigestion ?Negative: Headache, vision changes, numbness ? ?Physical Exam  ?BP (!) 163/107 (BP Location: Left Arm)   Pulse 80   Temp 98 ?F (36.7 ?C) (Oral)   Resp 19   SpO2 96%  ?Gen:   Awake, no distress   ?Resp:  Normal effort  ?MSK:   Moves extremities without difficulty ?Other:  Lungs are clear to auscultation bilaterally, no facial asymmetry moving all extremities ? ?Medical Decision Making  ?Medically screening exam initiated at 8:27 PM.  Appropriate orders placed.  Lee Phillips was informed that the remainder of the evaluation will be completed by another provider, this initial triage assessment does not replace that evaluation, and the importance of remaining in the ED until their evaluation is complete. ? ?Work-up initiated ?  ?Delia Heady, PA-C ?08/16/21 2029 ? ?

## 2021-08-17 LAB — TROPONIN I (HIGH SENSITIVITY): Troponin I (High Sensitivity): 4 ng/L (ref <18)

## 2021-08-17 NOTE — ED Provider Notes (Signed)
?[facility] [facility] ?Provider Note ? ?CSN: [id number] ?Arrival date & time: 08/16/21 1926 ? ?Chief Complaint(s) ?No chief complaint on file. ? ?HPI ?Lee Phillips is a 61 y.o. male here for evaluation of elevated blood pressures.  Patient reports that he took his blood pressure at home and noted the they were elevated at 180s/100s.  He reports checking them because he felt off throughout the day after not being able to sleep well.  He went to urgent care who referred him here for further evaluation. ? ?Patient denied any associated chest pain or shortness of breath but did endorse having indigestion earlier in the day shortly after eating [place] food.  This was short-lived.  He reports he did not have the indigestion when he took his blood pressure.  At baseline patient has mild edema related to DVT for which she is anticoagulated. ? ?He reports that he recently had his blood pressure medications increased 2 months ago.  Denies any other medication changes.  He did endorse using Preparation H for the last 2 weeks.  No other over-the-counter medication. ? ?HPI ? ?Past Medical History ?Past Medical History:  ?Diagnosis Date  ? A-fib ([place])   ? History of kidney stones   ? Hypertension   ? Lupus anticoagulant with hypercoagulable state ([place]) 05/16/2011  ? Lymphedema   ? Personal history of DVT (deep vein thrombosis)   ? and PE  ? ?Patient Active Problem List  ? Diagnosis Date Noted  ? Fever presenting with conditions classified elsewhere 07/14/2014  ? Pulmonary embolism ([place]) 07/11/2014  ? Thrombocytopenia ([place]) 07/11/2014  ? Elevated troponin 07/11/2014  ? Lupus anticoagulant with hypercoagulable state ([place]) 05/16/2011  ? OTHER AND UNSPECIFIED COAGULATION DEFECTS 03/02/2010  ? Essential hypertension 03/02/2010  ? Atrial fibrillation ([place]) 03/02/2010  ? LYMPHEDEMA, RIGHT LEG 03/02/2010  ? HEMATOCHEZIA 03/02/2010  ? DEEP VENOUS THROMBOPHLEBITIS, HX OF 03/02/2010  ? ?Home  Medication(s) ?Prior to Admission medications   ?Medication Sig Start Date End Date Taking? Authorizing Provider  ?aspirin 81 MG chewable tablet Chew 2 tablets (162 mg total) by mouth daily. 09/23/17   Shelly Coss, MD  ?dabigatran (PRADAXA) 150 MG CAPS capsule TAKE 1 CAPSULE BY MOUTH EVERY 12 HOURS 07/30/21   Volanda Napoleon, MD  ?lisinopril-hydrochlorothiazide (PRINZIDE,ZESTORETIC) 20-12.5 MG per tablet Take 1 tablet by mouth daily.    [provider]  ?metoprolol (LOPRESSOR) 25 MG tablet Take 1 tablet (25 mg total) by mouth 2 (two) times daily. 07/18/14   [name], MD  ?Multiple Vitamins-Minerals (MULTI-VITAMIN GUMMIES PO) Take 2 tablets by mouth daily.    [provider]  ?                                                                                                                                  ?Allergies ?Patient has no active allergies. ? ?Review of Systems ?Review of Systems ?As noted in HPI ? ?  Physical Exam ?Vital Signs  ?I have reviewed the triage vital signs ?BP (!) 149/90   Pulse 71   Temp 98 ?F (36.7 ?C) (Oral)   Resp 19   Ht '6\' 4"'$  (1.93 m)   Wt 120.7 kg   SpO2 96%   BMI 32.38 kg/m?  ? ?Physical Exam ?Vitals reviewed.  ?Constitutional:   ?   General: He is not in acute distress. ?   Appearance: He is well-developed. He is not diaphoretic.  ?HENT:  ?   Head: Normocephalic and atraumatic.  ?   Nose: Nose normal.  ?Eyes:  ?   General: No scleral icterus.    ?   Right eye: No discharge.     ?   Left eye: No discharge.  ?   Conjunctiva/sclera: Conjunctivae normal.  ?   Pupils: Pupils are equal, round, and reactive to light.  ?Cardiovascular:  ?   Rate and Rhythm: Normal rate and regular rhythm.  ?   Heart sounds: No murmur heard. ?  No friction rub. No gallop.  ?Pulmonary:  ?   Effort: Pulmonary effort is normal. No respiratory distress.  ?   Breath sounds: Normal breath sounds. No stridor. No rales.  ?Abdominal:  ?   General: There is no distension.  ?   Palpations:  Abdomen is soft.  ?   Tenderness: There is no abdominal tenderness.  ?Musculoskeletal:     ?   General: No tenderness.  ?   Cervical back: Normal range of motion and neck supple.  ?   Right lower leg: Edema present.  ?   Left lower leg: Edema present.  ?Skin: ?   General: Skin is warm and dry.  ?   Findings: No erythema or rash.  ?Neurological:  ?   Mental Status: He is alert and oriented to person, place, and time.  ? ? ?ED Results and Treatments ?Labs ?(all labs ordered are listed, but only abnormal results are displayed) ?Labs Reviewed  ?CBC WITH DIFFERENTIAL/PLATELET - Abnormal; Notable for the following components:  ?    Result Value  ? RBC 6.10 (*)   ? All other components within normal limits  ?COMPREHENSIVE METABOLIC PANEL  ?TROPONIN I (HIGH SENSITIVITY)  ?TROPONIN I (HIGH SENSITIVITY)  ?                                                                                                                       ?EKG ? EKG Interpretation ? ?Date/Time:  Tuesday Aug 17 2021 02:26:06 EDT ?Ventricular Rate:  73 ?PR Interval:  162 ?QRS Duration: 84 ?QT Interval:  358 ?QTC Calculation: 394 ?R Axis:   54 ?Text Interpretation: Normal sinus rhythm Possible Left atrial enlargement Cannot rule out Anterior infarct , age undetermined Abnormal ECG When compared with ECG of 20-Sep-2017 04:47, PREVIOUS ECG IS PRESENT Confirmed by Addison Lank [phone]) on 08/17/2021 2:38:19 AM ?  ? ?  ? ?Radiology ?DG Chest 2 View ? ?Result Date: 08/16/2021 ?CLINICAL DATA:  Indigestion.  EXAM: CHEST - 2 VIEW COMPARISON:  Chest x-ray 05/07/2020. FINDINGS: The heart size and mediastinal contours are within normal limits. Both lungs are clear. The visualized skeletal structures are unremarkable. IMPRESSION: No active cardiopulmonary disease. Electronically Signed   By: Ronney Asters M.D.   On: 08/16/2021 21:14   ? ?Pertinent labs & imaging results that were available during my care of the patient were reviewed by me and considered in my medical decision  making (see MDM for details). ? ?Medications Ordered in ED ?Medications - No data to display                                                               ?                                                                    ?Procedures ?Procedures ? ?(including critical care time) ? ?Medical Decision Making / ED Course ? ? ? Complexity of Problem: ? ?Co-morbidities/SDOH that complicate the patient evaluation/care: ?Hypertension ?DVT/PEs on anticoagulation ? ?Patient's presenting problem/concern, DDX, and MDM listed below: ?Elevated blood pressures ?Patient's blood pressure is confirmed at urgent care to be in the 180s ?Here his blood pressure was 140s.  Improving without intervention. ?Work-up obtained to rule out endorgan damage. ?Etiology is likely secondary to vasoconstricting medication and increase salt intake. ?Indigestion ?Likely secondary to [place] food ?Given comorbidities will rule out ACS though I have low suspicion for this ? ?Hospitalization Considered:  ?No ? ? ?  Complexity of Data: ?  ?Cardiac Monitoring: ?EKG without acute ischemic changes or evidence of pericarditis ? ?Laboratory Tests ordered listed below with my independent interpretation: ?CBC without leukocytosis or anemia ?No significant electrolyte derangements or renal sufficiency ?Serial troponins negative x2 ?  ?Imaging Studies ordered listed below with my independent interpretation: ?Chest x-ray without evidence of pneumonia, pneumothorax, pulmonary edema or pleural effusion ?  ?  ?ED Course:   ? ?Assessment, Add'l Intervention, and Reassessment: ?Elevated blood pressure ?Resolved without intervention ?Likely secondary to over-the-counter medication.  Patient educated on such. ?No endorgan damage.  ?Indigestion ?Ruled out for ACS ?Low suspicion for other serious/emergent processes ? ?Final Clinical Impression(s) / ED Diagnoses ?Final diagnoses:  ?Elevated blood pressure reading  ? ?The patient appears reasonably screened and/or  stabilized for discharge and I doubt any other medical condition or other [facility] requiring further screening, evaluation, or treatment in the ED at this time prior to discharge. Safe for discharge with strict return precautions. ? ?Disposit

## 2022-06-05 IMAGING — CR DG CHEST 2V
2 series · 2 of 2 positions shown · non-contrast
Comparison: Chest x-ray 05/07/2020.

CLINICAL DATA: Indigestion.

EXAM:
CHEST - 2 VIEW

[chest pa]
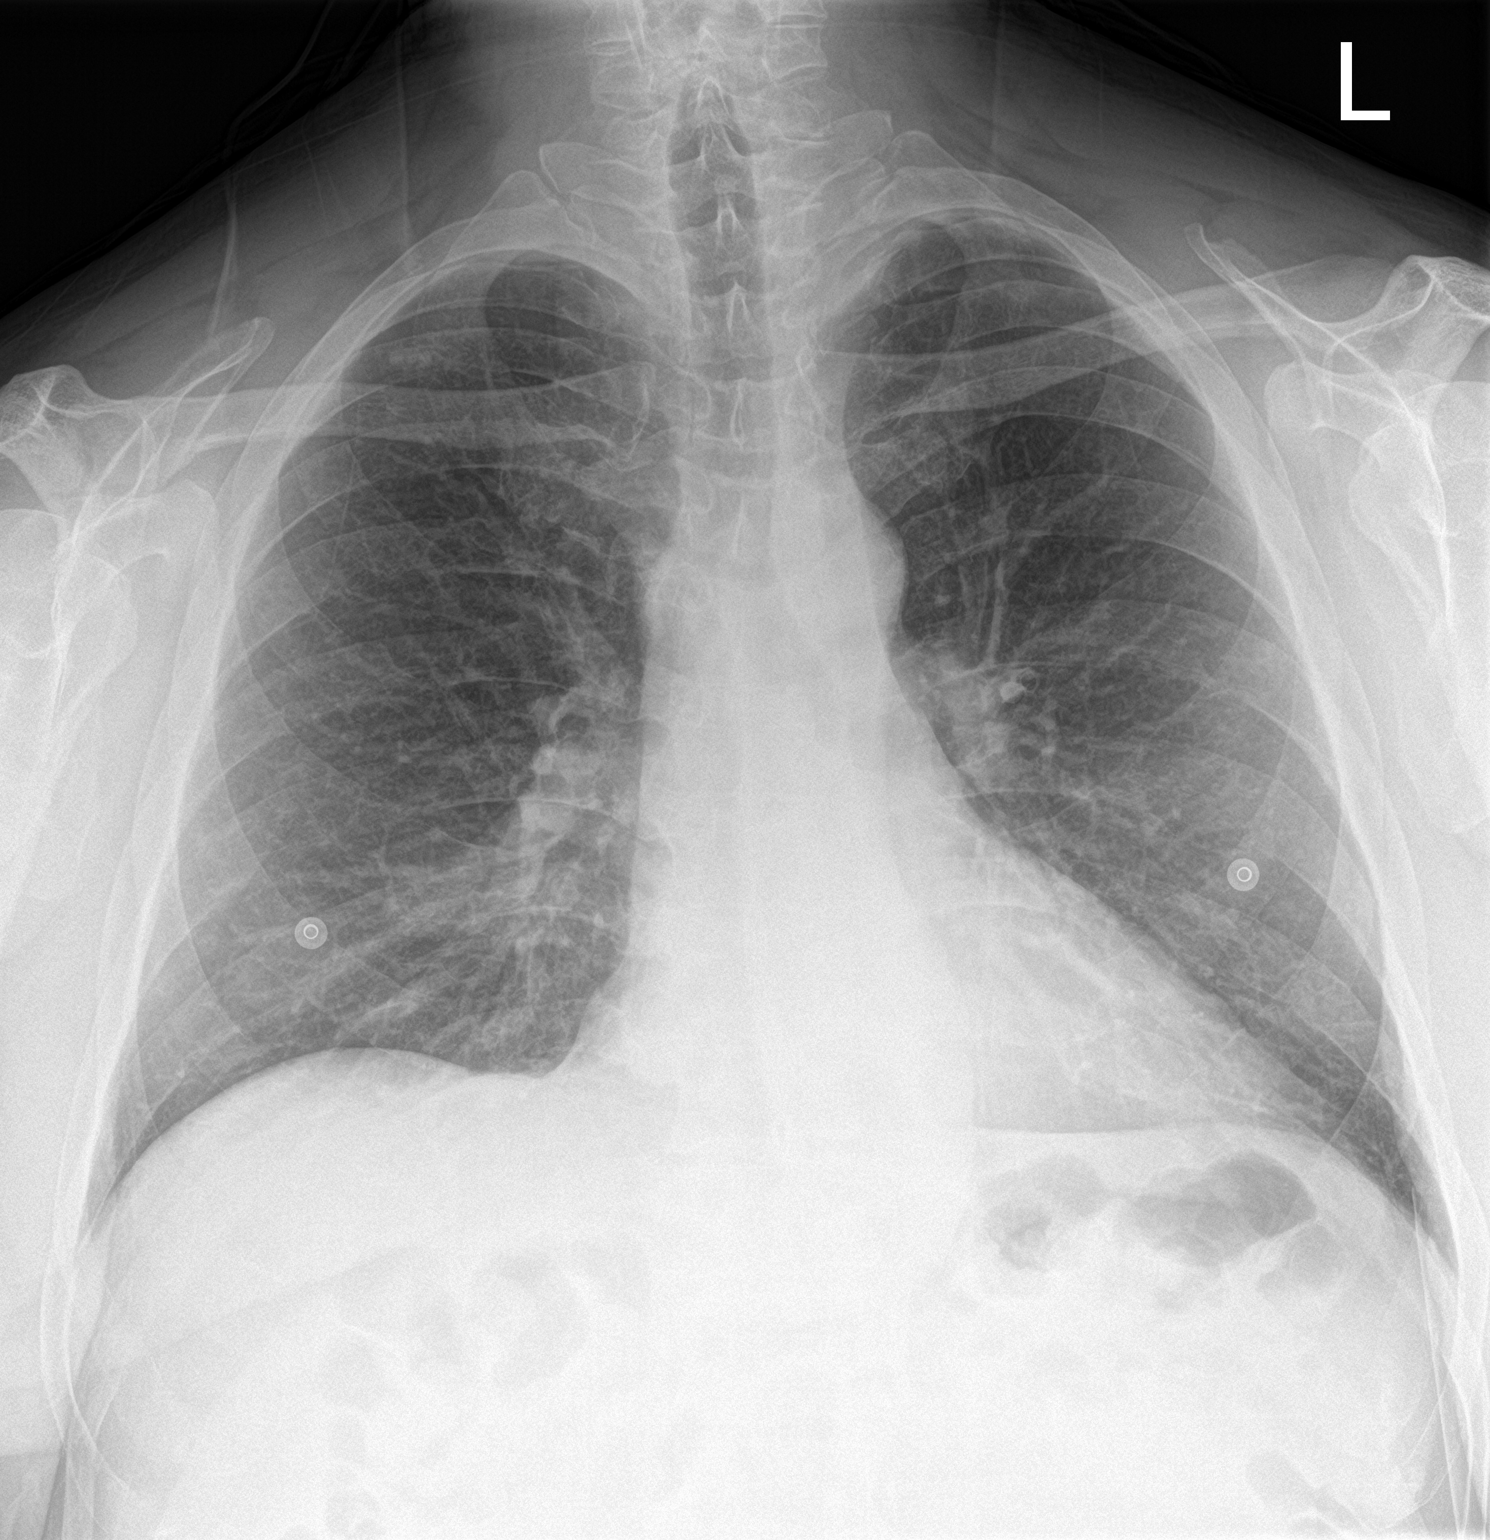

[chest lat]
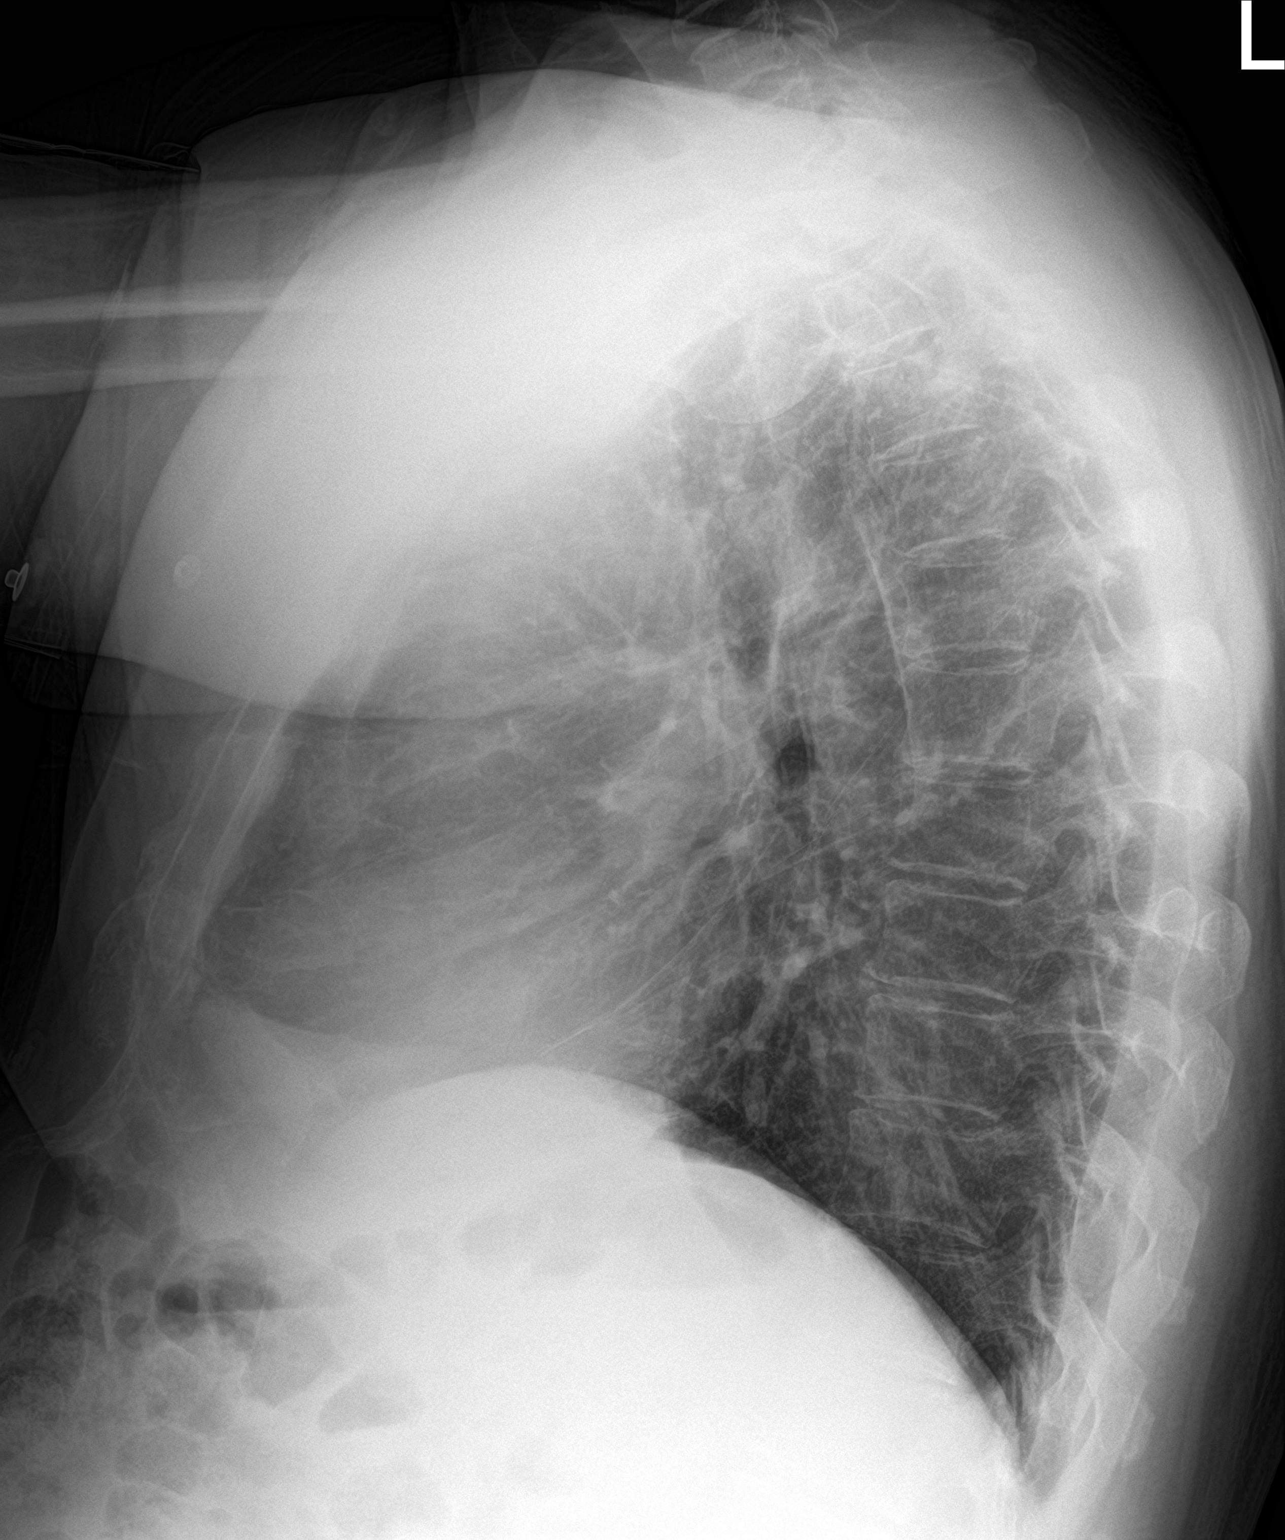

[2 of 2 positions shown; findings below may reference images not displayed]

FINDINGS: The heart size and mediastinal contours are within normal limits.
Both lungs are clear. The visualized skeletal structures are
unremarkable.
IMPRESSION: No active cardiopulmonary disease.

## 2022-07-04 ENCOUNTER — Other Ambulatory Visit: Payer: Self-pay

## 2022-07-04 ENCOUNTER — Inpatient Hospital Stay: Payer: 59 | Attending: Hematology & Oncology

## 2022-07-04 ENCOUNTER — Encounter: Payer: Self-pay | Admitting: Hematology & Oncology

## 2022-07-04 ENCOUNTER — Inpatient Hospital Stay: Payer: 59 | Admitting: Hematology & Oncology

## 2022-07-04 VITALS — BP 164/93 | HR 78 | Temp 98.4°F | Resp 18 | Ht 76.0 in | Wt 268.0 lb

## 2022-07-04 DIAGNOSIS — Z86711 Personal history of pulmonary embolism: Secondary | ICD-10-CM | POA: Insufficient documentation

## 2022-07-04 DIAGNOSIS — Z7901 Long term (current) use of anticoagulants: Secondary | ICD-10-CM | POA: Diagnosis not present

## 2022-07-04 DIAGNOSIS — D6862 Lupus anticoagulant syndrome: Secondary | ICD-10-CM

## 2022-07-04 DIAGNOSIS — Z7982 Long term (current) use of aspirin: Secondary | ICD-10-CM | POA: Insufficient documentation

## 2022-07-04 DIAGNOSIS — Z86718 Personal history of other venous thrombosis and embolism: Secondary | ICD-10-CM | POA: Insufficient documentation

## 2022-07-04 LAB — CBC WITH DIFFERENTIAL (CANCER CENTER ONLY)
Abs Immature Granulocytes: 0.02 10*3/uL (ref 0.00–0.07)
Basophils Absolute: 0.1 10*3/uL (ref 0.0–0.1)
Basophils Relative: 1 %
Eosinophils Absolute: 0.5 10*3/uL (ref 0.0–0.5)
Eosinophils Relative: 7 %
HCT: 48.8 % (ref 39.0–52.0)
Hemoglobin: 16.7 g/dL (ref 13.0–17.0)
Immature Granulocytes: 0 %
Lymphocytes Relative: 19 %
Lymphs Abs: 1.5 10*3/uL (ref 0.7–4.0)
MCH: 27.8 pg (ref 26.0–34.0)
MCHC: 34.2 g/dL (ref 30.0–36.0)
MCV: 81.2 fL (ref 80.0–100.0)
Monocytes Absolute: 0.6 10*3/uL (ref 0.1–1.0)
Monocytes Relative: 8 %
Neutro Abs: 5.2 10*3/uL (ref 1.7–7.7)
Neutrophils Relative %: 65 %
Platelet Count: 204 10*3/uL (ref 150–400)
RBC: 6.01 MIL/uL — ABNORMAL HIGH (ref 4.22–5.81)
RDW: 14 % (ref 11.5–15.5)
WBC Count: 7.9 10*3/uL (ref 4.0–10.5)
nRBC: 0 % (ref 0.0–0.2)

## 2022-07-04 LAB — CMP (CANCER CENTER ONLY)
ALT: 20 U/L (ref 0–44)
AST: 20 U/L (ref 15–41)
Albumin: 4.3 g/dL (ref 3.5–5.0)
Alkaline Phosphatase: 64 U/L (ref 38–126)
Anion gap: 9 (ref 5–15)
BUN: 15 mg/dL (ref 8–23)
CO2: 28 mmol/L (ref 22–32)
Calcium: 9.7 mg/dL (ref 8.9–10.3)
Chloride: 102 mmol/L (ref 98–111)
Creatinine: 1.24 mg/dL (ref 0.61–1.24)
GFR, Estimated: >60 mL/min (ref >60)
Glucose, Bld: 100 mg/dL — ABNORMAL HIGH (ref 70–99)
Potassium: 4 mmol/L (ref 3.5–5.1)
Sodium: 139 mmol/L (ref 135–145)
Total Bilirubin: 1.1 mg/dL (ref 0.3–1.2)
Total Protein: 7.4 g/dL (ref 6.5–8.1)

## 2022-07-04 LAB — LACTATE DEHYDROGENASE: LDH: 129 U/L (ref 98–192)

## 2022-07-04 NOTE — Progress Notes (Signed)
Hematology and Oncology Follow Up Visit  JAKYRIN BEMAN [id number]:[id number] [birth year] 62 y.o. 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  Lee Phillips is back for for follow-up.  It has been a year since we last saw him.  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  This happened 3 times.  It had been right after he saw [place] last April.  He currently has a silicone bubble in his eye.  Hopefully, this will keep the retina attached.  Otherwise, he has been doing okay.  Has been working.  Is been pretty busy at work.  He has had problems with prostatitis.  He has been taking some ibuprofen for this.  He says this does help.  I told him that he must take the ibuprofen with food as he is on the Pradaxa.  He is also on aspirin.  He has had some dysuria.  Is been no hematuria.  He has had no nausea or vomiting.  Has had no cough or shortness of breath.  Henrene Pastor does have the swelling in his right leg.  Has a compression stocking on the leg.  Currently, I would have said that his performance status is probably ECOG 1.     Medications:  Current Outpatient Medications:    tamsulosin (FLOMAX) 0.4 MG CAPS capsule, Take 0.4 mg by mouth daily., Disp: , Rfl:    aspirin 81 MG chewable tablet, Chew 2 tablets (162 mg total) by mouth daily., Disp: 30 tablet, Rfl: 0   dabigatran (PRADAXA) 150 MG CAPS capsule, TAKE 1 CAPSULE BY MOUTH EVERY 12 HOURS, Disp: 180 capsule, Rfl: 3   lisinopril-hydrochlorothiazide (PRINZIDE,ZESTORETIC) 20-12.5 MG per tablet, Take 1 tablet by mouth daily., Disp: , Rfl:    metoprolol (LOPRESSOR) 25 MG tablet, Take 1 tablet (25 mg total) by mouth 2 (two) times daily., Disp: 60 tablet, Rfl: 0   Multiple Vitamins-Minerals (MULTI-VITAMIN  GUMMIES PO), Take 2 tablets by mouth daily., Disp: , Rfl:   Current Facility-Administered Medications:    0.9 %  sodium chloride infusion, 500 mL, Intravenous, Once, Cirigliano, Vito V, DO  Allergies:  Allergies  Allergen Reactions   Suvorexant Other (See Comments)    Other Reaction(s): ineffective   Trazodone Hcl Other (See Comments)    Other Reaction(s): ineffective    Past Medical History, Surgical history, Social history, and Family History were reviewed and updated.  Review of Systems: Review of Systems  Constitutional: Negative.   HENT: Negative.    Eyes: Negative.   Respiratory: Negative.    Cardiovascular: Negative.   Gastrointestinal: Negative.   Genitourinary: Negative.   Musculoskeletal: Negative.   Skin: Negative.   Neurological: Negative.   Endo/Heme/Allergies: Negative.   Psychiatric/Behavioral: Negative.       Physical Exam:  height is 6\' 4"  (1.93 m) and weight is 268 lb (121.6 kg). His oral temperature is 98.4 F (36.9 C). His blood pressure is 158/95 (abnormal) and his pulse is 78. His respiration is 18 and oxygen saturation is 97%.   Physical Exam Vitals reviewed.  HENT:     Head: Normocephalic and atraumatic.  Eyes:     Pupils: Pupils are equal, round, and reactive to light.  Cardiovascular:     Rate and  Rhythm: Normal rate and regular rhythm.     Heart sounds: Normal heart sounds.  Pulmonary:     Effort: Pulmonary effort is normal.     Breath sounds: Normal breath sounds.  Abdominal:     General: Bowel sounds are normal.     Palpations: Abdomen is soft.  Musculoskeletal:        General: No tenderness or deformity. Normal range of motion.     Cervical back: Normal range of motion.  Lymphadenopathy:     Cervical: No cervical adenopathy.  Skin:    General: Skin is warm and dry.     Findings: No erythema or rash.  Neurological:     Mental Status: He is alert and oriented to person, place, and time.  Psychiatric:        Behavior: Behavior  normal.        Thought Content: Thought content normal.        Judgment: Judgment normal.      Lab Results  Component Value Date   WBC 7.9 07/04/2022   HGB 16.7 07/04/2022   HCT 48.8 07/04/2022   MCV 81.2 07/04/2022   PLT 204 07/04/2022     Chemistry      Component Value Date/Time   NA 137 08/16/2021 2036   NA 141 02/27/2017 0745   NA 138 02/24/2016 0753   K 4.3 08/16/2021 2036   K 3.9 02/27/2017 0745   K 4.2 02/24/2016 0753   CL 104 08/16/2021 2036   CL 102 02/27/2017 0745   CO2 24 08/16/2021 2036   CO2 31 02/27/2017 0745   CO2 25 02/24/2016 0753   BUN 14 08/16/2021 2036   BUN 14 02/27/2017 0745   BUN 13.8 02/24/2016 0753   CREATININE 1.12 08/16/2021 2036   CREATININE 1.14 07/06/2021 0751   CREATININE 1.3 (H) 02/27/2017 0745   CREATININE 0.9 02/24/2016 0753      Component Value Date/Time   CALCIUM 9.8 08/16/2021 2036   CALCIUM 9.5 02/27/2017 0745   CALCIUM 9.5 02/24/2016 0753   ALKPHOS 57 08/16/2021 2036   ALKPHOS 63 02/27/2017 0745   ALKPHOS 77 02/24/2016 0753   AST 24 08/16/2021 2036   AST 15 07/06/2021 0751   AST 21 02/24/2016 0753   ALT 27 08/16/2021 2036   ALT 19 07/06/2021 0751   ALT 27 02/27/2017 0745   ALT 25 02/24/2016 0753   BILITOT 1.0 08/16/2021 2036   BILITOT 1.0 07/06/2021 0751   BILITOT 1.05 02/24/2016 0753      Impression and Plan: Mr. [name] is 62 year old gentleman.  He has a positive lupus anticoagulant.  He is on lifelong anticoagulation.  Hopefully, the right eye will heal up.  I know this is quite a bit of struggle for him.  Hopefully, this will get better.  Otherwise, everything is going well with respect to the thromboembolic disease.  He is on lifelong anticoagulation along with mild dose aspirin.  For right now, we will still plan to get back in 1 year.    Volanda Napoleon, MD 4/1/20248:11 AM

## 2022-07-15 ENCOUNTER — Other Ambulatory Visit: Payer: Self-pay | Admitting: Hematology & Oncology

## 2022-10-28 ENCOUNTER — Emergency Department (HOSPITAL_BASED_OUTPATIENT_CLINIC_OR_DEPARTMENT_OTHER)
Admission: EM | Admit: 2022-10-28 | Discharge: 2022-10-28 | Disposition: A | Discharge status: Home / Self Care | Payer: Worker's Compensation | Attending: Emergency Medicine | Admitting: Emergency Medicine

## 2022-10-28 ENCOUNTER — Encounter (HOSPITAL_BASED_OUTPATIENT_CLINIC_OR_DEPARTMENT_OTHER): Payer: Self-pay | Admitting: Emergency Medicine

## 2022-10-28 ENCOUNTER — Other Ambulatory Visit: Payer: Self-pay

## 2022-10-28 DIAGNOSIS — I10 Essential (primary) hypertension: Secondary | ICD-10-CM | POA: Diagnosis not present

## 2022-10-28 DIAGNOSIS — T594X1A Toxic effect of chlorine gas, accidental (unintentional), initial encounter: Secondary | ICD-10-CM | POA: Insufficient documentation

## 2022-10-28 DIAGNOSIS — Z77098 Contact with and (suspected) exposure to other hazardous, chiefly nonmedicinal, chemicals: Secondary | ICD-10-CM

## 2022-10-28 DIAGNOSIS — Z7982 Long term (current) use of aspirin: Secondary | ICD-10-CM | POA: Diagnosis not present

## 2022-10-28 DIAGNOSIS — Y990 Civilian activity done for income or pay: Secondary | ICD-10-CM | POA: Insufficient documentation

## 2022-10-28 DIAGNOSIS — X58XXXA Exposure to other specified factors, initial encounter: Secondary | ICD-10-CM | POA: Insufficient documentation

## 2022-10-28 DIAGNOSIS — Z79899 Other long term (current) drug therapy: Secondary | ICD-10-CM | POA: Insufficient documentation

## 2022-10-28 DIAGNOSIS — S0592XA Unspecified injury of left eye and orbit, initial encounter: Secondary | ICD-10-CM | POA: Diagnosis present

## 2022-10-28 MED ORDER — FLUORESCEIN SODIUM 1 MG OP STRP
1.0000 | ORAL_STRIP | Freq: Once | OPHTHALMIC | Status: AC
  Administered 2022-10-28: 1 via OPHTHALMIC
  Filled 2022-10-28: qty 1

## 2022-10-28 MED ORDER — TETRACAINE HCL 0.5 % OP SOLN
2.0000 [drp] | Freq: Once | OPHTHALMIC | Status: AC
  Administered 2022-10-28: 2 [drp] via OPHTHALMIC
  Filled 2022-10-28: qty 4

## 2022-10-28 NOTE — ED Provider Notes (Signed)
  [place] EMERGENCY DEPARTMENT AT [facility] Provider Note   CSN: [id number] Arrival date & time: 10/28/22  0807     History {Add pertinent medical, surgical, social history, OB history to HPI:1} Chief Complaint  Patient presents with   Chemical Exposure    Lee Phillips is a 62 y.o. male.  HPI     62yo male with history of atrial fibrillation lupus anticoagulant, DVT/PE   Past Medical History:  Diagnosis Date   A-fib [facility])    History of kidney stones    Hypertension    Lupus anticoagulant with hypercoagulable state ([facility]) 05/16/2011   Lymphedema    Personal history of DVT (deep vein thrombosis)    and PE     Home Medications Prior to Admission medications   Medication Sig Start Date End Date Taking? Authorizing Provider  aspirin 81 MG chewable tablet Chew 2 tablets (162 mg total) by mouth daily. 09/23/17   Burnadette Pop, MD  dabigatran (PRADAXA) 150 MG CAPS capsule TAKE 1 CAPSULE BY MOUTH EVERY 12 HOURS 07/16/22   Josph Macho, MD  lisinopril-hydrochlorothiazide (PRINZIDE,ZESTORETIC) 20-12.5 MG per tablet Take 1 tablet by mouth daily.    [provider]  metoprolol (LOPRESSOR) 25 MG tablet Take 1 tablet (25 mg total) by mouth 2 (two) times daily. 07/18/14   Penny Pia, MD  Multiple Vitamins-Minerals (MULTI-VITAMIN GUMMIES PO) Take 2 tablets by mouth daily.    [provider]  tamsulosin (FLOMAX) 0.4 MG CAPS capsule Take 0.4 mg by mouth daily. 05/05/22   [provider]      Allergies    Suvorexant and Trazodone hcl    Review of Systems   Review of Systems  Physical Exam Updated Vital Signs BP (!) 165/95 (BP Location: Right Arm)   Pulse 79   Temp 98.2 F (36.8 C) (Oral)   Resp 16   Ht 6\' 4"  (1.93 m)   Wt 117.9 kg   SpO2 100%   BMI 31.65 kg/m  Physical Exam  ED Results / Procedures / Treatments   Labs (all labs ordered are listed, but only abnormal results are displayed) Labs Reviewed - No data to  display  EKG None  Radiology No results found.  Procedures Procedures  {Document cardiac monitor, telemetry assessment procedure when appropriate:1}  Medications Ordered in ED Medications  tetracaine (PONTOCAINE) 0.5 % ophthalmic solution 2 drop (has no administration in time range)  fluorescein ophthalmic strip 1 strip (has no administration in time range)    ED Course/ Medical Decision Making/ A&P   {   Click here for ABCD2, HEART and other calculatorsREFRESH Note before signing :1}                          Medical Decision Making Risk Prescription drug management.   ***  {Document critical care time when appropriate:1} {Document review of labs and clinical decision tools ie heart score, Chads2Vasc2 etc:1}  {Document your independent review of radiology images, and any outside records:1} {Document your discussion with family members, caretakers, and with consultants:1} {Document social determinants of health affecting pt's care:1} {Document your decision making why or why not admission, treatments were needed:1} Final Clinical Impression(s) / ED Diagnoses Final diagnoses:  None    Rx / DC Orders ED Discharge Orders     None

## 2022-10-28 NOTE — ED Triage Notes (Signed)
Pt arrives pov, steady gait c/o ~ 12.5% bleach sprayed in bilateral eyes, Worse in left pta when at work. Endorses rinsing eyes pta and was rinsed for 2 minutes upon arrival. Bilateral redness noted.

## 2022-10-28 NOTE — ED Notes (Signed)
Patient taken to eye wash station, eyes flushed for two minutes before taking to room.  States eyes were flushed at work for several minutes prior to arriving to our facility.

## 2022-10-28 NOTE — ED Notes (Signed)
Flushed another 300 mL. Retested pH left eye at 7. Notified MD to reheck patient.

## 2022-10-28 NOTE — ED Notes (Signed)
Visual acuity. Right - retinal detachment surgery, cannot see the top line. Left - 20/25. Tested bilateral eyes with pH paper. Left eye 9. Right eye 7. 2 drops of tetracaine left eye prior to flush. Began flushing left eye with Morgan lens and NS fluid.

## 2022-10-28 NOTE — ED Notes (Signed)
Flushed 200. Retested pH left eye - 8.

## 2023-06-16 ENCOUNTER — Other Ambulatory Visit: Payer: Self-pay | Admitting: Hematology & Oncology

## 2023-07-03 ENCOUNTER — Inpatient Hospital Stay: Payer: 59

## 2023-07-03 ENCOUNTER — Ambulatory Visit: Payer: 59 | Admitting: Hematology & Oncology

## 2023-09-01 ENCOUNTER — Other Ambulatory Visit: Payer: Self-pay

## 2023-09-01 DIAGNOSIS — D6862 Lupus anticoagulant syndrome: Secondary | ICD-10-CM

## 2023-09-04 ENCOUNTER — Encounter: Payer: Self-pay | Admitting: Hematology & Oncology

## 2023-09-04 ENCOUNTER — Other Ambulatory Visit: Payer: Self-pay

## 2023-09-04 ENCOUNTER — Inpatient Hospital Stay: Attending: Hematology & Oncology

## 2023-09-04 ENCOUNTER — Inpatient Hospital Stay (HOSPITAL_BASED_OUTPATIENT_CLINIC_OR_DEPARTMENT_OTHER): Admitting: Hematology & Oncology

## 2023-09-04 VITALS — BP 145/87 | HR 66 | Temp 98.2°F | Resp 16 | Ht 76.0 in | Wt 268.0 lb

## 2023-09-04 DIAGNOSIS — D6862 Lupus anticoagulant syndrome: Secondary | ICD-10-CM

## 2023-09-04 DIAGNOSIS — Z7901 Long term (current) use of anticoagulants: Secondary | ICD-10-CM | POA: Insufficient documentation

## 2023-09-04 DIAGNOSIS — I2699 Other pulmonary embolism without acute cor pulmonale: Secondary | ICD-10-CM | POA: Insufficient documentation

## 2023-09-04 DIAGNOSIS — Z7902 Long term (current) use of antithrombotics/antiplatelets: Secondary | ICD-10-CM | POA: Diagnosis not present

## 2023-09-04 LAB — CBC WITH DIFFERENTIAL (CANCER CENTER ONLY)
Abs Immature Granulocytes: 0.03 10*3/uL (ref 0.00–0.07)
Basophils Absolute: 0.1 10*3/uL (ref 0.0–0.1)
Basophils Relative: 1 %
Eosinophils Absolute: 0.3 10*3/uL (ref 0.0–0.5)
Eosinophils Relative: 5 %
HCT: 49.3 % (ref 39.0–52.0)
Hemoglobin: 16.6 g/dL (ref 13.0–17.0)
Immature Granulocytes: 1 %
Lymphocytes Relative: 25 %
Lymphs Abs: 1.7 10*3/uL (ref 0.7–4.0)
MCH: 27 pg (ref 26.0–34.0)
MCHC: 33.7 g/dL (ref 30.0–36.0)
MCV: 80.3 fL (ref 80.0–100.0)
Monocytes Absolute: 0.6 10*3/uL (ref 0.1–1.0)
Monocytes Relative: 9 %
Neutro Abs: 4 10*3/uL (ref 1.7–7.7)
Neutrophils Relative %: 59 %
Platelet Count: 208 10*3/uL (ref 150–400)
RBC: 6.14 MIL/uL — ABNORMAL HIGH (ref 4.22–5.81)
RDW: 13.6 % (ref 11.5–15.5)
WBC Count: 6.6 10*3/uL (ref 4.0–10.5)
nRBC: 0 % (ref 0.0–0.2)

## 2023-09-04 LAB — CMP (CANCER CENTER ONLY)
ALT: 17 U/L (ref 0–44)
AST: 16 U/L (ref 15–41)
Albumin: 4.8 g/dL (ref 3.5–5.0)
Alkaline Phosphatase: 58 U/L (ref 38–126)
Anion gap: 7 (ref 5–15)
BUN: 18 mg/dL (ref 8–23)
CO2: 29 mmol/L (ref 22–32)
Calcium: 9.9 mg/dL (ref 8.9–10.3)
Chloride: 103 mmol/L (ref 98–111)
Creatinine: 1.26 mg/dL — ABNORMAL HIGH (ref 0.61–1.24)
GFR, Estimated: >60 mL/min (ref >60)
Glucose, Bld: 110 mg/dL — ABNORMAL HIGH (ref 70–99)
Potassium: 4.4 mmol/L (ref 3.5–5.1)
Sodium: 139 mmol/L (ref 135–145)
Total Bilirubin: 1 mg/dL (ref 0.0–1.2)
Total Protein: 7.5 g/dL (ref 6.5–8.1)

## 2023-09-04 LAB — LACTATE DEHYDROGENASE: LDH: 175 U/L (ref 98–192)

## 2023-09-04 NOTE — Progress Notes (Signed)
 Hematology and Oncology Follow Up Visit  Lee Phillips [id number] July 18, 1960 63 y.o. 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 is back for for follow-up.  It has been a year since we last saw him.  He has now retired.  I am very happy for him.  He and his wife are now to the [employer] today.  He will do some fishing.  [name] on the Pradaxa  and aspirin .  He has done well with these.  He has had a little bit of pain in the right lower leg.  He is [place] swelling in the right lower leg, which is chronic.  He has had no bleeding.  He has had no nausea or vomiting.  Only some a year ago, he had problems with retinal detachment in the right eye.  I think this is resolved.  He has had no change in bowel or bladder habits.  He has had no rashes.  He has had no fever.  There is been no problems with COVID or Influenza.  Overall, I will have to say that his performance status is probably ECOG 1.     Medications:  Current Outpatient Medications:    aspirin  81 MG chewable tablet, Chew 2 tablets (162 mg total) by mouth daily., Disp: 30 tablet, Rfl: 0   dabigatran  (PRADAXA ) 150 MG CAPS capsule, TAKE 1 CAPSULE BY MOUTH EVERY 12 HOURS, Disp: 180 capsule, Rfl: 3   lisinopril -hydrochlorothiazide  (PRINZIDE ,ZESTORETIC ) 20-12.5 MG per tablet, Take 1 tablet by mouth daily., Disp: , Rfl:    metoprolol  (LOPRESSOR ) 25 MG tablet, Take 1 tablet (25 mg total) by mouth 2 (two) times daily., Disp: 60 tablet, Rfl: 0   Multiple Vitamins-Minerals (MULTI-VITAMIN GUMMIES PO), Take 2 tablets by mouth daily., Disp: , Rfl:    tamsulosin (FLOMAX) 0.4 MG CAPS capsule, Take 0.4 mg by mouth daily., Disp: , Rfl:   Current Facility-Administered Medications:    0.9 %   sodium chloride  infusion, 500 mL, Intravenous, Once, Cirigliano, Vito V, DO  Allergies:  Allergies  Allergen Reactions   Suvorexant Other (See Comments)    Other Reaction(s): ineffective   Trazodone  Hcl Other (See Comments)    Other Reaction(s): ineffective    Past Medical History, Surgical history, Social history, and Family History were reviewed and updated.  Review of Systems: Review of Systems  Constitutional: Negative.   HENT: Negative.    Eyes: Negative.   Respiratory: Negative.    Cardiovascular: Negative.   Gastrointestinal: Negative.   Genitourinary: Negative.   Musculoskeletal: Negative.   Skin: Negative.   Neurological: Negative.   Endo/Heme/Allergies: Negative.   Psychiatric/Behavioral: Negative.        Vital signs are temperature of shock 98.2.  Pulse 66.  Blood pressure 145/87.  Weight is 268 pounds.  Physical Exam Vitals reviewed.  HENT:     Head: Normocephalic and atraumatic.  Eyes:     Pupils: Pupils are equal, round, and reactive to light.  Cardiovascular:     Rate and Rhythm: Normal rate and regular rhythm.     Heart sounds: Normal heart sounds.  Pulmonary:     Effort: Pulmonary effort is normal.     Breath sounds: Normal breath sounds.  Abdominal:  General: Bowel sounds are normal.     Palpations: Abdomen is soft.  Musculoskeletal:        General: No tenderness or deformity. Normal range of motion.     Cervical back: Normal range of motion.  Lymphadenopathy:     Cervical: No cervical adenopathy.  Skin:    General: Skin is warm and dry.     Findings: No erythema or rash.  Neurological:     Mental Status: He is alert and oriented to person, place, and time.  Psychiatric:        Behavior: Behavior normal.        Thought Content: Thought content normal.        Judgment: Judgment normal.      Lab Results  Component Value Date   WBC 6.6 09/04/2023   HGB 16.6 09/04/2023   HCT 49.3 09/04/2023   MCV 80.3 09/04/2023   PLT 208  09/04/2023     Chemistry      Component Value Date/Time   NA 139 07/04/2022 0741   NA 141 02/27/2017 0745   NA 138 02/24/2016 0753   K 4.0 07/04/2022 0741   K 3.9 02/27/2017 0745   K 4.2 02/24/2016 0753   CL 102 07/04/2022 0741   CL 102 02/27/2017 0745   CO2 28 07/04/2022 0741   CO2 31 02/27/2017 0745   CO2 25 02/24/2016 0753   BUN 15 07/04/2022 0741   BUN 14 02/27/2017 0745   BUN 13.8 02/24/2016 0753   CREATININE 1.24 07/04/2022 0741   CREATININE 1.3 (H) 02/27/2017 0745   CREATININE 0.9 02/24/2016 0753      Component Value Date/Time   CALCIUM 9.7 07/04/2022 0741   CALCIUM 9.5 02/27/2017 0745   CALCIUM 9.5 02/24/2016 0753   ALKPHOS 64 07/04/2022 0741   ALKPHOS 63 02/27/2017 0745   ALKPHOS 77 02/24/2016 0753   AST 20 07/04/2022 0741   AST 21 02/24/2016 0753   ALT 20 07/04/2022 0741   ALT 27 02/27/2017 0745   ALT 25 02/24/2016 0753   BILITOT 1.1 07/04/2022 0741   BILITOT 1.05 02/24/2016 0753      Impression and Plan: Lee Phillips is 63 year old gentleman.  He has a positive lupus anticoagulant.  He is on lifelong anticoagulation.  So far, things are looking quite good.  I do not see any problems with respect to recurrence of the thromboembolism.  He continues on the Pradaxa  and aspirin .  This I think is a good combination for him.  For right now, we will plan to have him come back in another year.  Maybe, if everything looks okay, then we can probably let him go from the practice.  I am so happy that he is able to retire and enjoy his life.    Lee Mars, MD 6/2/20258:17 AM

## 2023-09-25 ENCOUNTER — Other Ambulatory Visit: Payer: Self-pay | Admitting: *Deleted

## 2023-09-25 MED ORDER — DABIGATRAN ETEXILATE MESYLATE 150 MG PO CAPS: 150.0000 mg | ORAL_CAPSULE | Freq: Two times a day (BID) | ORAL | 3 refills | Status: AC

## 2023-09-27 DIAGNOSIS — I480 Paroxysmal atrial fibrillation: Secondary | ICD-10-CM | POA: Diagnosis not present

## 2023-09-27 DIAGNOSIS — Z86718 Personal history of other venous thrombosis and embolism: Secondary | ICD-10-CM | POA: Diagnosis not present

## 2023-09-27 DIAGNOSIS — N400 Enlarged prostate without lower urinary tract symptoms: Secondary | ICD-10-CM | POA: Diagnosis not present

## 2023-09-27 DIAGNOSIS — I10 Essential (primary) hypertension: Secondary | ICD-10-CM | POA: Diagnosis not present

## 2023-12-21 DIAGNOSIS — N401 Enlarged prostate with lower urinary tract symptoms: Secondary | ICD-10-CM | POA: Diagnosis not present

## 2024-01-03 DIAGNOSIS — R051 Acute cough: Secondary | ICD-10-CM | POA: Diagnosis not present

## 2024-01-03 DIAGNOSIS — I10 Essential (primary) hypertension: Secondary | ICD-10-CM | POA: Diagnosis not present

## 2024-01-03 DIAGNOSIS — J40 Bronchitis, not specified as acute or chronic: Secondary | ICD-10-CM | POA: Diagnosis not present

## 2024-01-03 DIAGNOSIS — R062 Wheezing: Secondary | ICD-10-CM | POA: Diagnosis not present

## 2024-01-30 DIAGNOSIS — J069 Acute upper respiratory infection, unspecified: Secondary | ICD-10-CM | POA: Diagnosis not present

## 2024-02-22 DIAGNOSIS — I10 Essential (primary) hypertension: Secondary | ICD-10-CM | POA: Diagnosis not present

## 2024-09-03 ENCOUNTER — Inpatient Hospital Stay

## 2024-09-03 ENCOUNTER — Ambulatory Visit: Admitting: Hematology & Oncology
